# Patient Record
Sex: Female | Born: 1990 | Race: Black or African American | Hispanic: No | Marital: Single | Smoking: Current some day smoker
Health system: Southern US, Community
[De-identification: ages and names within clinical notes are randomized; demographics above are authoritative.]

## PROBLEM LIST (undated history)

## (undated) DIAGNOSIS — S060XAA Concussion with loss of consciousness status unknown, initial encounter: Secondary | ICD-10-CM

## (undated) DIAGNOSIS — R51 Headache: Secondary | ICD-10-CM

## (undated) DIAGNOSIS — S060X9A Concussion with loss of consciousness of unspecified duration, initial encounter: Secondary | ICD-10-CM

---
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
## 2012-04-10 ENCOUNTER — Encounter (HOSPITAL_COMMUNITY): Payer: Self-pay | Admitting: *Deleted

## 2012-04-10 ENCOUNTER — Emergency Department (HOSPITAL_COMMUNITY)
Admission: EM | Admit: 2012-04-10 | Discharge: 2012-04-10 | Disposition: A | Discharge status: Home / Self Care | Payer: Self-pay | Attending: Emergency Medicine | Admitting: Emergency Medicine

## 2012-04-10 DIAGNOSIS — N760 Acute vaginitis: Secondary | ICD-10-CM | POA: Insufficient documentation

## 2012-04-10 DIAGNOSIS — R110 Nausea: Secondary | ICD-10-CM | POA: Insufficient documentation

## 2012-04-10 DIAGNOSIS — R10819 Abdominal tenderness, unspecified site: Secondary | ICD-10-CM | POA: Insufficient documentation

## 2012-04-10 DIAGNOSIS — B9689 Other specified bacterial agents as the cause of diseases classified elsewhere: Secondary | ICD-10-CM | POA: Insufficient documentation

## 2012-04-10 DIAGNOSIS — A499 Bacterial infection, unspecified: Secondary | ICD-10-CM | POA: Insufficient documentation

## 2012-04-10 DIAGNOSIS — R109 Unspecified abdominal pain: Secondary | ICD-10-CM | POA: Insufficient documentation

## 2012-04-10 HISTORY — DX: Concussion with loss of consciousness of unspecified duration, initial encounter: S06.0X9A

## 2012-04-10 HISTORY — DX: Headache: R51

## 2012-04-10 HISTORY — DX: Concussion with loss of consciousness status unknown, initial encounter: S06.0XAA

## 2012-04-10 LAB — CBC WITH DIFFERENTIAL/PLATELET
Basophils Absolute: 0 10*3/uL (ref 0.0–0.1)
Basophils Relative: 0 % (ref 0–1)
Eosinophils Absolute: 0 10*3/uL (ref 0.0–0.7)
Eosinophils Relative: 1 % (ref 0–5)
Hemoglobin: 13 g/dL (ref 12.0–15.0)
Lymphocytes Relative: 21 % (ref 12–46)
Lymphs Abs: 1.1 10*3/uL (ref 0.7–4.0)
MCH: 30.1 pg (ref 26.0–34.0)
MCHC: 34 g/dL (ref 30.0–36.0)
Monocytes Absolute: 0.3 10*3/uL (ref 0.1–1.0)
Monocytes Relative: 6 % (ref 3–12)
RBC: 4.32 MIL/uL (ref 3.87–5.11)
WBC: 5.4 10*3/uL (ref 4.0–10.5)

## 2012-04-10 LAB — POCT I-STAT, CHEM 8
BUN: 11 mg/dL (ref 6–23)
Calcium, Ion: 1.25 mmol/L — ABNORMAL HIGH (ref 1.12–1.23)
Glucose, Bld: 96 mg/dL (ref 70–99)

## 2012-04-10 LAB — WET PREP, GENITAL: Yeast Wet Prep HPF POC: NONE SEEN

## 2012-04-10 LAB — URINALYSIS, ROUTINE W REFLEX MICROSCOPIC
Bilirubin Urine: NEGATIVE
Glucose, UA: NEGATIVE mg/dL
Leukocytes, UA: NEGATIVE

## 2012-04-10 MED ORDER — IBUPROFEN 600 MG PO TABS: 600.0000 mg | ORAL_TABLET | Freq: Three times a day (TID) | ORAL | Status: DC | PRN

## 2012-04-10 MED ORDER — LIDOCAINE HCL (PF) 1 % IJ SOLN
INTRAMUSCULAR | Status: AC
  Filled 2012-04-10: qty 5

## 2012-04-10 MED ORDER — ONDANSETRON 4 MG PO TBDP
4.0000 mg | ORAL_TABLET | Freq: Once | ORAL | Status: AC
  Administered 2012-04-10: 4 mg via ORAL
  Filled 2012-04-10: qty 1

## 2012-04-10 MED ORDER — IBUPROFEN 400 MG PO TABS
600.0000 mg | ORAL_TABLET | Freq: Once | ORAL | Status: AC
  Administered 2012-04-10: 600 mg via ORAL
  Filled 2012-04-10: qty 2

## 2012-04-10 MED ORDER — METRONIDAZOLE 500 MG PO TABS: 500.0000 mg | ORAL_TABLET | Freq: Two times a day (BID) | ORAL | Status: DC

## 2012-04-10 MED ORDER — AZITHROMYCIN 250 MG PO TABS
1000.0000 mg | ORAL_TABLET | Freq: Once | ORAL | Status: AC
  Administered 2012-04-10: 1000 mg via ORAL
  Filled 2012-04-10: qty 4

## 2012-04-10 MED ORDER — CEFTRIAXONE SODIUM 250 MG IJ SOLR
250.0000 mg | Freq: Once | INTRAMUSCULAR | Status: AC
  Administered 2012-04-10: 250 mg via INTRAMUSCULAR
  Filled 2012-04-10: qty 250

## 2012-04-10 NOTE — ED Provider Notes (Signed)
Chronic intermittent mild headaches off-and-on for years lasting anywhere from several hours to several days at a time with gradual onset no sudden onset and no change in speech vision swallowing or understanding as well as no focal weakness numbness or incoordination or vertigo. She also has intermittent vague mild periumbilical abdominal pain off-and-on several hours at a time for the last couple weeks without associated vomiting diarrhea vaginal bleeding vaginal discharge or dysuria. She is no back pain chest pain or shortness breath. She does have some nausea. Her abdomen is soft and nontender at this time.  Hurman Horn, MD 04/11/12 [phone]

## 2012-04-10 NOTE — ED Provider Notes (Signed)
History     CSN: [id number]  Arrival date & time 04/10/12  1049   First MD Initiated Contact with Patient 04/10/12 1540      No chief complaint on file.   (Consider location/radiation/quality/duration/timing/severity/associated sxs/prior treatment) Patient is a 21 y.o. female presenting with abdominal pain. The history is provided by the patient.  Abdominal Pain The primary symptoms of the illness include abdominal pain, nausea and dysuria. The primary symptoms of the illness do not include fever, fatigue, shortness of breath, vomiting, diarrhea, vaginal discharge or vaginal bleeding. Episode onset: intermittently for several weeks. The onset of the illness was gradual. The problem has not changed since onset. The abdominal pain began more than 2 days ago. The pain came on gradually. The abdominal pain has been unchanged since its onset. The abdominal pain is located in the periumbilical region. The abdominal pain does not radiate. The abdominal pain is relieved by nothing.  Nausea began more than 1 week ago.  The dysuria is not associated with hematuria, frequency, urgency or vaginal pain. Dyspareunia: crampy feeling when urinating.   Additional symptoms associated with the illness include back pain. Symptoms associated with the illness do not include chills, anorexia, diaphoresis, heartburn, constipation, urgency, hematuria or frequency.    Past Medical History  Diagnosis Date  . Headache   . Concussion     from basketball in high school     History reviewed. No pertinent past surgical history.  History reviewed. No pertinent family history.  History  Substance Use Topics  . Smoking status: Current Some Day Smoker  . Smokeless tobacco: Not on file  . Alcohol Use: No    OB History    Grav Para Term Preterm Abortions TAB SAB Ect Mult Living                  Review of Systems  Constitutional: Negative for fever, chills, diaphoresis and fatigue.  Respiratory: Negative  for cough and shortness of breath.   Cardiovascular: Negative for chest pain.  Gastrointestinal: Positive for nausea and abdominal pain. Negative for heartburn, vomiting, diarrhea, constipation and anorexia.  Genitourinary: Positive for dysuria. Negative for urgency, frequency, hematuria, decreased urine volume, vaginal bleeding, vaginal discharge and vaginal pain. Dyspareunia: crampy feeling when urinating.  Musculoskeletal: Positive for back pain.  Neurological: Negative for headaches.  All other systems reviewed and are negative.    Allergies  Review of patient's allergies indicates no known allergies.  Home Medications  No current outpatient prescriptions on file.  BP 111/62  Pulse 90  Temp 98.1 F (36.7 C) (Oral)  Resp 18  SpO2 100%  LMP 02/24/2012  Physical Exam  Nursing note and vitals reviewed. Constitutional: She is oriented to person, place, and time. She appears well-developed and well-nourished. No distress.  HENT:  Head: Normocephalic and atraumatic.  Eyes: EOM are normal. Pupils are equal, round, and reactive to light.  Neck: Normal range of motion.  Cardiovascular: Normal rate and normal heart sounds.   Pulmonary/Chest: Effort normal and breath sounds normal. No respiratory distress.  Abdominal: Soft. She exhibits no distension. There is tenderness in the periumbilical area. There is CVA tenderness (right side).    Musculoskeletal: Normal range of motion.  Neurological: She is alert and oriented to person, place, and time.  Skin: Skin is warm and dry.    ED Course  Procedures (including critical care time)  Labs Reviewed  POCT I-STAT, CHEM 8 - Abnormal; Notable for the following:    Calcium, Ion 1.25 (*)  All other components within normal limits  WET PREP, GENITAL - Abnormal; Notable for the following:    Clue Cells Wet Prep HPF POC MODERATE (*)     WBC, Wet Prep HPF POC FEW (*)     All other components within normal limits  URINALYSIS, ROUTINE W  REFLEX MICROSCOPIC  CBC WITH DIFFERENTIAL  PREGNANCY, URINE  GC/CHLAMYDIA PROBE AMP, GENITAL   No results found.   1. Abdominal pain   2. Bacterial vaginosis       MDM  3:40 PM Pt seen and examined. Pt complains of intermittent abdominal pain off and on for several weeks. It is associated with intermittent nausea. Pt also mentions a mild headache that she has been told is from her concussions while playing basketball. Will do pelvic. Benign exam-no focal tenderness or neuro deficits. Do not feel patient needs imaging for these intermittent symptoms.  4:15 PM Pt with no pain on formal pelvic. Will treat for asx chlamydia/GC. Once pregnancy test is back, will treat pain with motrin.  5:20 PM Wet prep also shows clue cells, so will also treat for BV.         Daleen Bo, MD 04/11/12 [phone]

## 2012-04-10 NOTE — ED Notes (Signed)
Feel so hot. Nausea. Started on/off for a while; real bad last night. Cramping.

## 2012-04-11 NOTE — ED Provider Notes (Signed)
I saw and evaluated the patient, reviewed the resident's note and I agree with the findings and plan.  Hurman Horn, MD 04/11/12 [phone]

## 2013-03-04 ENCOUNTER — Emergency Department (HOSPITAL_COMMUNITY): Payer: Medicaid Other

## 2013-03-04 ENCOUNTER — Encounter (HOSPITAL_COMMUNITY): Payer: Self-pay | Admitting: Adult Health

## 2013-03-04 ENCOUNTER — Emergency Department (HOSPITAL_COMMUNITY)
Admission: EM | Admit: 2013-03-04 | Discharge: 2013-03-05 | Disposition: A | Discharge status: Home / Self Care | Payer: Medicaid Other | Attending: Emergency Medicine | Admitting: Emergency Medicine

## 2013-03-04 DIAGNOSIS — N760 Acute vaginitis: Secondary | ICD-10-CM | POA: Insufficient documentation

## 2013-03-04 DIAGNOSIS — K5900 Constipation, unspecified: Secondary | ICD-10-CM | POA: Insufficient documentation

## 2013-03-04 DIAGNOSIS — R112 Nausea with vomiting, unspecified: Secondary | ICD-10-CM | POA: Insufficient documentation

## 2013-03-04 DIAGNOSIS — A499 Bacterial infection, unspecified: Secondary | ICD-10-CM | POA: Insufficient documentation

## 2013-03-04 DIAGNOSIS — Z3202 Encounter for pregnancy test, result negative: Secondary | ICD-10-CM | POA: Insufficient documentation

## 2013-03-04 DIAGNOSIS — R509 Fever, unspecified: Secondary | ICD-10-CM | POA: Insufficient documentation

## 2013-03-04 DIAGNOSIS — R51 Headache: Secondary | ICD-10-CM | POA: Insufficient documentation

## 2013-03-04 DIAGNOSIS — J029 Acute pharyngitis, unspecified: Secondary | ICD-10-CM | POA: Insufficient documentation

## 2013-03-04 DIAGNOSIS — Z79899 Other long term (current) drug therapy: Secondary | ICD-10-CM | POA: Insufficient documentation

## 2013-03-04 DIAGNOSIS — F17200 Nicotine dependence, unspecified, uncomplicated: Secondary | ICD-10-CM | POA: Insufficient documentation

## 2013-03-04 DIAGNOSIS — N83209 Unspecified ovarian cyst, unspecified side: Secondary | ICD-10-CM

## 2013-03-04 DIAGNOSIS — B9689 Other specified bacterial agents as the cause of diseases classified elsewhere: Secondary | ICD-10-CM | POA: Insufficient documentation

## 2013-03-04 DIAGNOSIS — R059 Cough, unspecified: Secondary | ICD-10-CM | POA: Insufficient documentation

## 2013-03-04 DIAGNOSIS — R05 Cough: Secondary | ICD-10-CM | POA: Insufficient documentation

## 2013-03-04 LAB — WET PREP, GENITAL: Trich, Wet Prep: NONE SEEN

## 2013-03-04 LAB — URINALYSIS, ROUTINE W REFLEX MICROSCOPIC
Glucose, UA: NEGATIVE mg/dL
Hgb urine dipstick: NEGATIVE
Leukocytes, UA: NEGATIVE
Protein, ur: NEGATIVE mg/dL
Specific Gravity, Urine: 1.023 (ref 1.005–1.030)

## 2013-03-04 LAB — RAPID STREP SCREEN (MED CTR MEBANE ONLY): Streptococcus, Group A Screen (Direct): NEGATIVE

## 2013-03-04 MED ORDER — IOHEXOL 300 MG/ML  SOLN
100.0000 mL | Freq: Once | INTRAMUSCULAR | Status: AC | PRN
  Administered 2013-03-04: 100 mL via INTRAVENOUS

## 2013-03-04 MED ORDER — ONDANSETRON HCL 4 MG/2ML IJ SOLN
4.0000 mg | Freq: Once | INTRAMUSCULAR | Status: AC
  Administered 2013-03-04: 4 mg via INTRAVENOUS
  Filled 2013-03-04: qty 2

## 2013-03-04 MED ORDER — MORPHINE SULFATE 2 MG/ML IJ SOLN
2.0000 mg | Freq: Once | INTRAMUSCULAR | Status: AC
  Administered 2013-03-04: 2 mg via INTRAVENOUS
  Filled 2013-03-04: qty 1

## 2013-03-04 MED ORDER — SODIUM CHLORIDE 0.9 % IV BOLUS (SEPSIS)
1000.0000 mL | Freq: Once | INTRAVENOUS | Status: AC
  Administered 2013-03-04: 1000 mL via INTRAVENOUS

## 2013-03-04 MED ORDER — IOHEXOL 300 MG/ML  SOLN
25.0000 mL | INTRAMUSCULAR | Status: AC
  Administered 2013-03-04: 25 mL via ORAL

## 2013-03-04 NOTE — ED Notes (Signed)
Patient transported to CT 

## 2013-03-04 NOTE — ED Provider Notes (Signed)
CSN: [id number]     Arrival date & time 03/04/13  1550 History   First MD Initiated Contact with Patient 03/04/13 1702     Chief Complaint  Patient presents with  . multiple complaints    (Consider location/radiation/quality/duration/timing/severity/associated sxs/prior Treatment) The history is provided by the patient. No language interpreter was used.  Kaitlin Duran is a 22 y/o F with history of headache and concussion presenting to emergency department with multiple complaints. Patient reported that she has been having ongoing sore throat for the past couple days, described the sore throat as a tightening, hard pain that gets worse with foods more so than with liquids. Patient reported that she's still able to swallow. Reported that she's been using hot tea and gargling with salt water with minimal relief. Patient reported that she's developed a headache a couple of days ago that is intermittent, waxing and waning described as a frontal headache with radiation to bilateral temples described as a stabbing sensation. Reported that she has used nothing for the discomfort or relief of headache. Patient reported that she's been having lower abdominal pain described as a throbbing intermittent pain with a constant aching sensation, without radiation, with episodes of emesis and nausea. Patient reported nothing triggers the pain. Patient reported subjective fevers. Reported that she's been having a dry cough for the past couple of days. Patient reported that her last menstrual period was 12/28/2012. Patient reported that she took one dose of Depo-Provera. Reported that she's not sexually active. Denied chills, blurred vision, spotty vision, sudden loss of vision, fainting, Kaitlin Duran cautious when chills, dysuria, sores, lesions, vaginal discharge, sick contacts, chest pain, short of breath, difficulty breathing, diarrhea, melena, hematochezia. PCP none  Past Medical History  Diagnosis Date  .  Headache(784.0)   . Concussion     from basketball in high school    History reviewed. No pertinent past surgical history. History reviewed. No pertinent family history. History  Substance Use Topics  . Smoking status: Current Some Day Smoker  . Smokeless tobacco: Not on file  . Alcohol Use: No   OB History   Grav Para Term Preterm Abortions TAB SAB Ect Mult Living                 Review of Systems  Constitutional: Positive for chills. Negative for fever.  HENT: Positive for sore throat. Negative for congestion, trouble swallowing, neck pain and neck stiffness.   Eyes: Negative for visual disturbance.  Respiratory: Positive for cough. Negative for chest tightness and shortness of breath.   Cardiovascular: Negative for chest pain.  Gastrointestinal: Positive for nausea and abdominal pain. Negative for vomiting, diarrhea, blood in stool and anal bleeding.  Genitourinary: Negative for dysuria, vaginal bleeding, vaginal discharge and vaginal pain.  Neurological: Positive for headaches. Negative for dizziness and weakness.  All other systems reviewed and are negative.    Allergies  Peanut-containing drug products  Home Medications   Current Outpatient Rx  Name  Route  Sig  Dispense  Refill  . metroNIDAZOLE (FLAGYL) 500 MG tablet   Oral   Take 1 tablet (500 mg total) by mouth 2 (two) times daily.   14 tablet   0   . polyethylene glycol (MIRALAX / GLYCOLAX) packet   Oral   Take 17 g by mouth daily.   14 each   0    BP 101/72  Pulse 90  Temp(Src) 98.8 F (37.1 C) (Oral)  Resp 16  Wt 135 lb (61.236 kg)  SpO2  99%  LMP 12/28/2012 Physical Exam  Nursing note and vitals reviewed. Constitutional: She is oriented to person, place, and time. She appears well-developed and well-nourished. No distress.  Patient found sitting up in bed watching television, patient appears comfortable. Food noted in room.  HENT:  Head: Normocephalic and atraumatic.  Mouth/Throat:  Oropharynx is clear and moist. No oropharyngeal exudate.  Negative swelling, erythema, exudate noted to the tonsils bilaterally. Negative petechiae noted to the soft palate or posterior oropharynx. Negative swelling, erythema, inflammation noted to the posterior oropharynx. Negative signs of peritonsillar abscess.  Eyes: Conjunctivae and EOM are normal. Pupils are equal, round, and reactive to light.  Neck: Normal range of motion. Neck supple.  Negative neck stiffness Negative nuchal rigidity Negative cervical lymphadenopathy Negative pain upon palpation to the cervical spine  Cardiovascular: Normal rate and regular rhythm.   Pulses:      Radial pulses are 2+ on the right side, and 2+ on the left side.       Dorsalis pedis pulses are 2+ on the right side, and 2+ on the left side.  Pulmonary/Chest: Effort normal and breath sounds normal. No respiratory distress. She has no wheezes. She has no rales. She exhibits no tenderness.  Abdominal: Soft. Bowel sounds are normal. She exhibits no distension. There is tenderness. There is no rebound and no guarding.  Pain upon palpation to the RLQ, suprapubic region, LLQ Positive McBurney's point Negative psoas and obturator Negative Murphy's sign  Genitourinary: Vagina normal. No vaginal discharge found.  Negative swelling, inflammation, erythema, lesions, sores noted to the external genitalia. Negative swelling, erythema, lesions, sores noted to the vaginal canal. Negative blood in the vaginal vault. Yeast-like discharge noted to the cervical region. Mild CMT noted. Discomfort upon palpation to the adnexal region bilaterally.   Musculoskeletal: Normal range of motion.  Lymphadenopathy:    She has no cervical adenopathy.  Neurological: She is alert and oriented to person, place, and time. No cranial nerve deficit. She exhibits normal muscle tone. Coordination normal.  Skin: Skin is warm and dry. No rash noted. She is not diaphoretic. No erythema.   Psychiatric: She has a normal mood and affect. Her behavior is normal. Thought content normal.    ED Course  Procedures (including critical care time)  7:30 PM Discussed case with Dr. Kathie Rhodes. Rancour, recommended CT scan to be performed.   03/05/2013 1:23 AM Discussed labs and imaging with patient while this provider was at bedside. Discussed with patient plan for discharge. All questions answered.   Labs Review Labs Reviewed  WET PREP, GENITAL - Abnormal; Notable for the following:    Clue Cells Wet Prep HPF POC FEW (*)    WBC, Wet Prep HPF POC FEW (*)    All other components within normal limits  CBC WITH DIFFERENTIAL - Abnormal; Notable for the following:    HCT 35.6 (*)    All other components within normal limits  RAPID STREP SCREEN  CULTURE, GROUP A STREP  GC/CHLAMYDIA PROBE AMP  URINALYSIS, ROUTINE W REFLEX MICROSCOPIC  COMPREHENSIVE METABOLIC PANEL  POCT PREGNANCY, URINE   Imaging Review Ct Abdomen Pelvis W Contrast  03/04/2013   CLINICAL DATA:  Two days sore throat, worse when the or drinking. Lower abdominal pain.  EXAM: CT ABDOMEN AND PELVIS WITH CONTRAST  TECHNIQUE: Multidetector CT imaging of the abdomen and pelvis was performed using the standard protocol following bolus administration of intravenous contrast.  CONTRAST:  OMNIPAQUE IOHEXOL 300 MG/ML  SOLN  COMPARISON:  None.  FINDINGS: The lung bases are clear.  The liver, spleen, gallbladder, pancreas, adrenal glands, kidneys, abdominal aorta, inferior vena cava, and retroperitoneal lymph nodes are unremarkable. The stomach is not abnormally distended and there is no gastric wall thickening. Small bowel are not distended. Stool and gas in the colon without distention. No free air or free fluid in the abdomen. Small umbilical hernia containing 1 wall of the transverse colon there is  Pelvis: There is an involuting cyst in the right ovary. Uterus and ovaries are not enlarged. Bladder wall is not thickened. No free or  loculated pelvic fluid collections. The appendix is normal. Stool-filled rectosigmoid colon without evidence of diverticulitis. No significant pelvic lymphadenopathy. Normal alignment of the lumbar vertebrae.  IMPRESSION: Involuting cyst in the right ovary. No acute process demonstrated in the abdomen or pelvis.   Electronically Signed   By: Burman Nieves   On: 03/04/2013 23:38    MDM   1. Ovarian cyst   2. Bacterial vaginosis   3. Constipation     Patient presenting to emergency department with multiple complaints. Patient reported that she has been having discomfort with swallowing has been ongoing for the past couple of days with association of headache. Patient reported that she's been experiencing lower abdominal pain localized to the right lower quadrant, left lower quadrant, suprapubic region that started on Saturday with associated nausea-denied vomiting. Alert and oriented. Discomfort upon palpation to the right lower quadrant, suprapubic region, left lower quadrant. Positive McBurney's point. Negative psoas and obturator. Negative Murphy sign. Bowel sounds normal active in all 4 quadrants. Negative acute abdomen, negative peritoneal signs. Eyes normal, PERRLA intact. Negative neurological deficits identified. Negative vaginal discharge identified upon pelvic exam. Mild discomfort upon suprapubic palpation with right and left adnexal tenderness upon palpation to the pelvic exam. Negative cervical motion tenderness identified. Rapid strep test negative. CBC negative findings. CMP negative findings. Urinalysis negative findings for infection. Urine pregnancy negative. CT abdomen pelvis with contrast involuting cyst to the right ovary. Appendix is normal. Stool filled rectosigmoid colon without evidence of diverticulitis.  Patient stable, afebrile. Suspicion of discomfort secondary to BV, constipation. No enlargement of the ovaries identified - doubt ovarian torsion. Discussed with patient that  she needs to be followed up with [facility] regarding ovarian cyst and the the fact that she has not had a menstrual cycle since December 28, 2012. Discharged patient with antibiotics and Miralax for the constipation. Discussed with patient to rest and stay hydrated. Discussed with patient that if symptoms are to worsen or change to report back to the ED - discussed with patient that if pain is to continue by Sunday to report back to the ED. Strict return instructions given.  Patient agreed to plan of care, understood, all questions answered.      Raymon Mutton, PA-C 03/05/13 1608

## 2013-03-04 NOTE — ED Notes (Addendum)
Presents with 2 days of sore throat worse when eating and drinking, lower abdominal pain, denies vaginal discharge and odor, headaches. Pt is eating and drinking chinese food at this time.  Afebrile.  Denies urinary symptoms

## 2013-03-05 LAB — CBC WITH DIFFERENTIAL/PLATELET
Basophils Absolute: 0 10*3/uL (ref 0.0–0.1)
Basophils Relative: 0 % (ref 0–1)
Hemoglobin: 12.3 g/dL (ref 12.0–15.0)
Lymphocytes Relative: 34 % (ref 12–46)
MCHC: 34.6 g/dL (ref 30.0–36.0)
Neutro Abs: 2.9 10*3/uL (ref 1.7–7.7)
Neutrophils Relative %: 56 % (ref 43–77)
RDW: 13.4 % (ref 11.5–15.5)
WBC: 5.3 10*3/uL (ref 4.0–10.5)

## 2013-03-05 LAB — GC/CHLAMYDIA PROBE AMP
CT Probe RNA: NEGATIVE
GC Probe RNA: NEGATIVE

## 2013-03-05 LAB — COMPREHENSIVE METABOLIC PANEL
ALT: 11 U/L (ref 0–35)
AST: 17 U/L (ref 0–37)
Albumin: 3.8 g/dL (ref 3.5–5.2)
Alkaline Phosphatase: 56 U/L (ref 39–117)
Chloride: 102 mEq/L (ref 96–112)
Potassium: 3.7 mEq/L (ref 3.5–5.1)
Total Bilirubin: 0.5 mg/dL (ref 0.3–1.2)

## 2013-03-05 MED ORDER — MORPHINE SULFATE 2 MG/ML IJ SOLN
2.0000 mg | Freq: Once | INTRAMUSCULAR | Status: AC
  Administered 2013-03-05: 2 mg via INTRAVENOUS
  Filled 2013-03-05: qty 1

## 2013-03-05 MED ORDER — POLYETHYLENE GLYCOL 3350 17 G PO PACK: 17.0000 g | PACK | Freq: Every day | ORAL | Status: DC

## 2013-03-05 MED ORDER — METRONIDAZOLE 500 MG PO TABS: 500.0000 mg | ORAL_TABLET | Freq: Two times a day (BID) | ORAL | Status: DC

## 2013-03-05 MED ORDER — ONDANSETRON HCL 4 MG/2ML IJ SOLN
4.0000 mg | Freq: Once | INTRAMUSCULAR | Status: AC
  Administered 2013-03-05: 4 mg via INTRAVENOUS
  Filled 2013-03-05: qty 2

## 2013-03-06 LAB — CULTURE, GROUP A STREP

## 2013-03-06 NOTE — ED Provider Notes (Signed)
Medical screening examination/treatment/procedure(s) were performed by non-physician practitioner and as supervising physician I was immediately available for consultation/collaboration.   Glynn Octave, MD 03/06/13 [phone]

## 2013-03-07 NOTE — ED Provider Notes (Signed)
03/06/2013 4:21 PM Spoke with patient's Mother, Mother reported that the number on file was not the patient's it was the mother's cellular phone. Mother gave this provider patient's cellphone number.   03/07/2013 3:30 PM Spoke with patient, verified by DOB and address. Patient reported that her abdominal pain has improved, mild discomfort. Patient reported that she is calling tomorrow to set-up an appointment with [facility]. Denied vaginal bleeding, vaginal discharge, nausea, vomiting. Patient reported that she is feeling better. Discussed with patient to continue to monitor symptoms and if symptoms are to worsen or change to report back to the ED - discussed that is abdominal pain is to worsen or change she is to come back to the ED to get re-assessed. Patient understood, agreed to plan, all questions answered.   Raymon Mutton, PA-C 03/07/13 1533

## 2013-03-07 NOTE — ED Provider Notes (Signed)
Medical screening examination/treatment/procedure(s) were performed by non-physician practitioner and as supervising physician I was immediately available for consultation/collaboration.   Glynn Octave, MD 03/07/13 1536

## 2013-04-21 ENCOUNTER — Ambulatory Visit (INDEPENDENT_AMBULATORY_CARE_PROVIDER_SITE_OTHER): Payer: Medicaid Other | Admitting: Obstetrics & Gynecology

## 2013-04-21 ENCOUNTER — Encounter: Payer: Self-pay | Admitting: Obstetrics & Gynecology

## 2013-04-21 VITALS — BP 122/77 | HR 81 | Temp 98.3°F | Ht 63.0 in | Wt 157.4 lb

## 2013-04-21 DIAGNOSIS — N83209 Unspecified ovarian cyst, unspecified side: Secondary | ICD-10-CM

## 2013-04-21 MED ORDER — NORGESTIM-ETH ESTRAD TRIPHASIC 0.18/0.215/0.25 MG-25 MCG PO TABS: 1.0000 | ORAL_TABLET | Freq: Every day | ORAL | Status: DC

## 2013-04-21 NOTE — Progress Notes (Signed)
  Subjective:    Patient ID: Kaitlin Duran, female    DOB: 1990/12/31, 22 y.o.   MRN: [id number]  HPI  [name] is a 22 yo S AA G0 who is here for follow up after a ovarian cyst was seen at the ED last month. She reports that the pain has decreased. It has never been strong enough for her to take even IBU. She declines prescription for IBU today. She is interested in restarting OCPs. She faithfully uses condoms and had no problems with the OCPs in the past. GC and CT were negative 9/14.  Review of Systems  She declines a flu vaccine today. She is in school at [employer].    Objective:   Physical Exam  NSSmid plane, NT, normal adnexal exam      Assessment & Plan:  Pelvic pain- resolving. She wants to restart OCPs. I will send a prescription to her pharmacy. RTC 2 weeks for BP check and then in 2 months to follow up on her pain.

## 2013-04-21 NOTE — Addendum Note (Signed)
Addended by: Faythe Casa on: 04/21/2013 04:20 PM   Modules accepted: Orders

## 2013-04-21 NOTE — Progress Notes (Signed)
Pt. C/o of abdominal pain that is intermittent and cramping in nature and lasts for hours at a time; has been occuring for about a year and started just after pt. Started depo injections. Pt. Had one dose and decided to stop depo provera. Was seen in the ED on 9/11 for pains and they told pt. She may have a cyst.

## 2013-04-21 NOTE — Patient Instructions (Signed)
Oral Contraception Use  Oral contraceptives (OCs) are medicines taken to prevent pregnancy. OCs work by preventing the ovaries from releasing eggs. The hormones in OCs also cause the cervical mucus to thicken, preventing the sperm from entering the uterus. The hormones also cause the uterine lining to become thin, not allowing a fertilized egg to attach to the inside of the uterus. OCs are highly effective when taken exactly as prescribed. However, OCs do not prevent sexually transmitted diseases (STDs). Safe sex practices, such as using condoms along with an OC, can help prevent STDs.   Before taking OCs, you may have a physical exam and Pap test. Your caregiver may also order blood tests if necessary. Your caregiver will make sure you are a good candidate for oral contraception. Discuss with your caregiver the possible side effects of the OC you may be prescribed. When starting an OC, it can take 2 to 3 months for the body to adjust to the changes in hormone levels in your body.   HOW TO TAKE ORAL CONTRACEPTIVES  Your caregiver may advise you on how to start taking the first cycle of OCs. Otherwise, you can:  · Start on day 1 of your menstrual period. You will not need any backup contraceptive protection with this start time.  · Start on the first Sunday after your menstrual period or the day you get your prescription. In these cases, you will need to use backup contraceptive protection for the first 7-day cycle.  After you have started taking OCs:  · If you forget to take 1 pill, take it as soon as you remember. Take the next pill at the regular time.  · If you miss 2 or more pills, use backup birth control until your next menstrual period starts.  · If you use a 28-day pack that contains inactive pills and you miss 1 of the last 7 pills (pills with no hormones), it will not matter. Throw away the rest of the non-hormone pills and start a new pill pack.  No matter which day you start the OC, you will always start  a new pack on that same day of the week. Have an extra pack of OCs and a backup contraceptive method available in case you miss some pills or lose your OC pack.  HOME CARE INSTRUCTIONS   · Do not smoke.  · Always use a condom to protect against STDs. OCs do not protect against STDs.  · Use a calendar to mark your menstrual period days.  · Read the information and directions that come with your OC. Talk to your caregiver if you have questions.  SEEK MEDICAL CARE IF:   · You develop nausea and vomiting.  · You have abnormal vaginal discharge or bleeding.  · You develop a rash.  · You miss your menstrual period.  · You are losing your hair.  · You need treatment for mood swings or depression.  · You get dizzy when taking the OC.  · You develop acne from taking the OC.  · You become pregnant.  SEEK IMMEDIATE MEDICAL CARE IF:   · You develop chest pain.  · You develop shortness of breath.  · You have an uncontrolled or severe headache.  · You develop numbness or slurred speech.  · You develop visual problems.  · You develop pain, redness, and swelling in the legs.  Document Released: 05/30/2011 Document Revised: 09/02/2011 Document Reviewed: 05/30/2011  [employer]® Patient Information ©2014 [employer].

## 2013-06-09 ENCOUNTER — Ambulatory Visit: Payer: Medicaid Other | Admitting: Obstetrics & Gynecology

## 2013-06-18 ENCOUNTER — Other Ambulatory Visit: Payer: Self-pay | Admitting: Obstetrics & Gynecology

## 2013-08-08 ENCOUNTER — Other Ambulatory Visit: Payer: Self-pay | Admitting: Obstetrics & Gynecology

## 2013-08-16 ENCOUNTER — Telehealth: Payer: Self-pay

## 2013-08-16 ENCOUNTER — Other Ambulatory Visit: Payer: Self-pay

## 2013-08-16 NOTE — Telephone Encounter (Signed)
Pt.'s [facility] pharmacy called stating pt. Is requesting a refill of her OCP. Called [facility] who stated pt. Last picked up her prescription 07/16/13 and it was prescribed on 06/30/13. Attempted to call pt. On both available numbers. Left message with pt.'s mother for pt. To call clinic. Medication has been refilled but pt. Will need to be seen in clinic before any other refills are given.

## 2013-08-16 NOTE — Telephone Encounter (Signed)
Pt. Called returning our call. Called pt. And informed her that her OCP has been refilled but that she will need to be seen by provider before any other refills are given. Pt. Verbalized understanding and stated [facility] and will make an appointment for mid march.

## 2013-09-27 ENCOUNTER — Ambulatory Visit: Payer: Medicaid Other | Admitting: Obstetrics & Gynecology

## 2015-05-15 IMAGING — CT CT ABD-PELV W/ CM
2 of 4 series · 16 of 46 positions shown, 18 images · IV contrast (omnipaque)
Comparison: None.

CLINICAL DATA: Two days sore throat, worse when the or drinking.
Lower abdominal pain.

EXAM:
CT ABDOMEN AND PELVIS WITH CONTRAST
TECHNIQUE: Multidetector CT imaging of the abdomen and pelvis was performed
using the standard protocol following bolus administration of
intravenous contrast.
CONTRAST:  100mL OMNIPAQUE IOHEXOL 300 MG/ML  SOLN

[Series 2: abd/ pelvis 5.0 i30f 1 · axial · 0.65mm/px · z∈[-465,-60]mm · 13 of 89 slices shown, 15 images]
[im 4/89  soft-tissue]
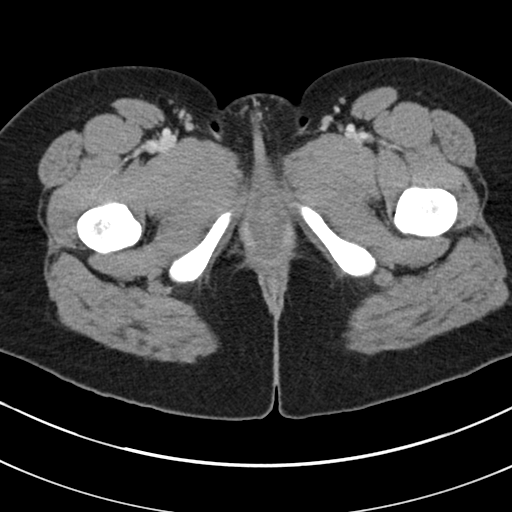
[im 4/89  bone]
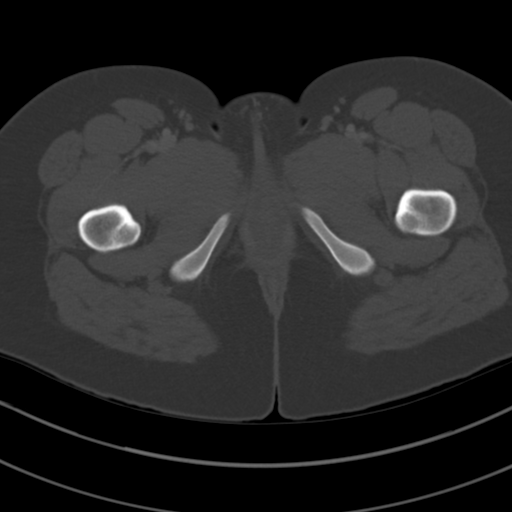
[im 11/89  soft-tissue]
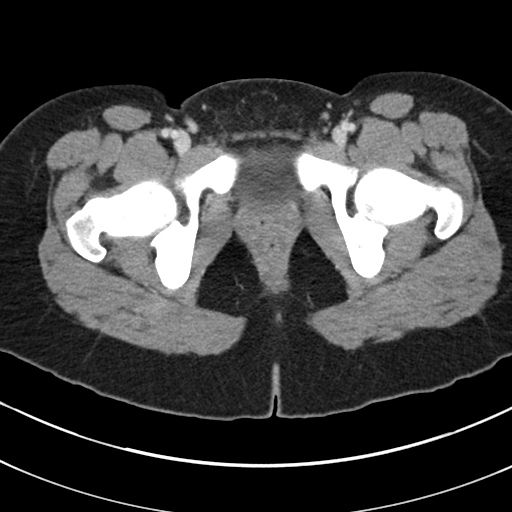
[im 18/89  soft-tissue]
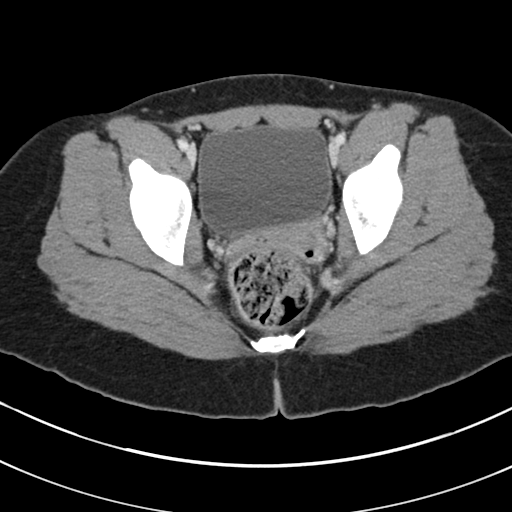
[im 25/89  soft-tissue]
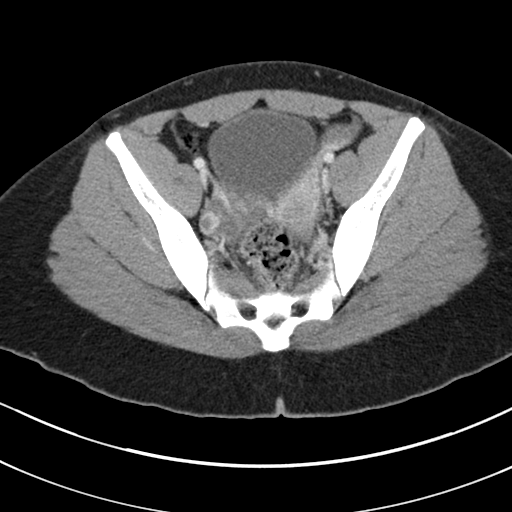
[im 32/89  soft-tissue]
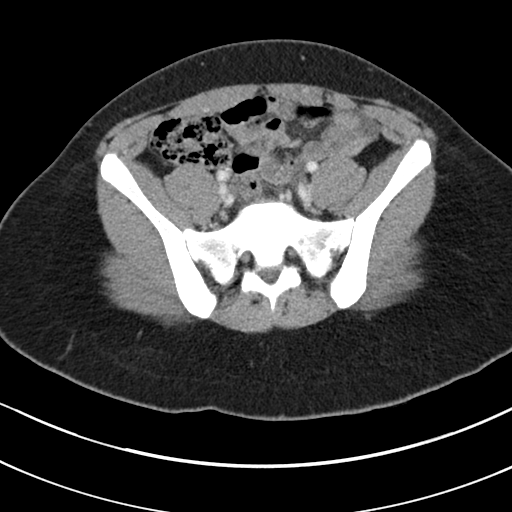
[im 39/89  soft-tissue]
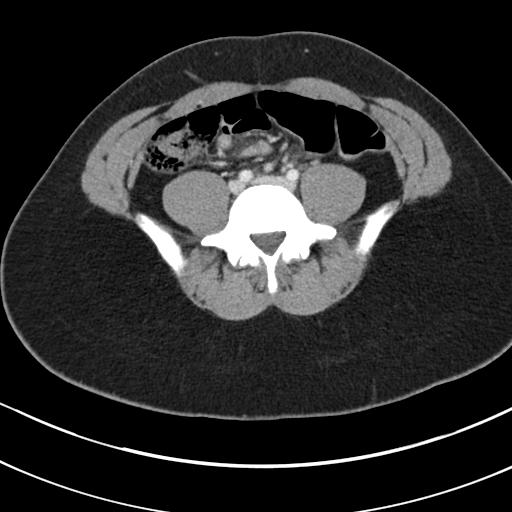
[im 46/89  soft-tissue]
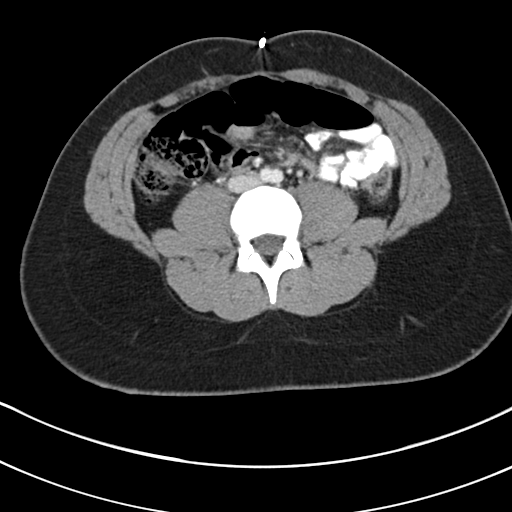
[im 50/89  soft-tissue]
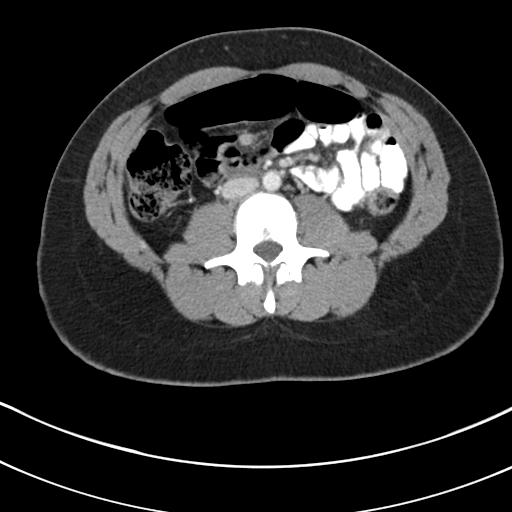
[im 57/89  soft-tissue]
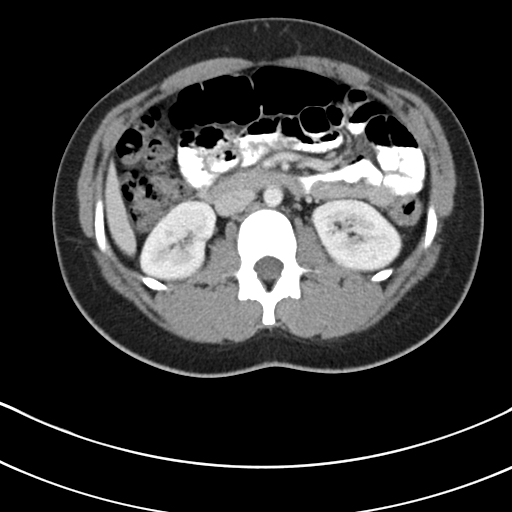
[im 57/89  bone]
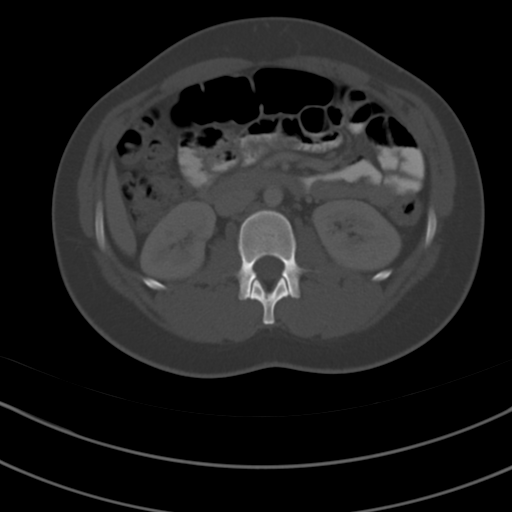
[im 64/89  soft-tissue]
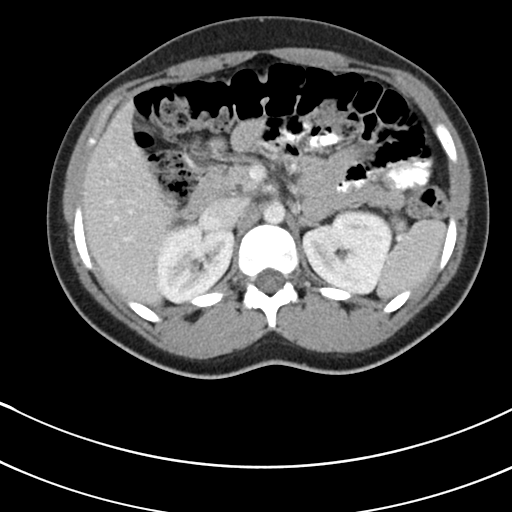
[im 71/89  soft-tissue]
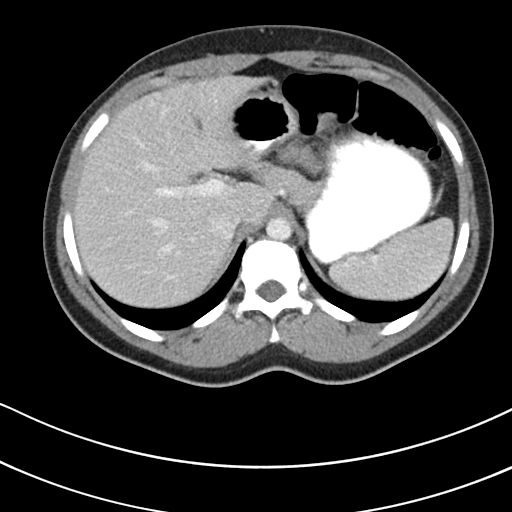
[im 78/89  soft-tissue]
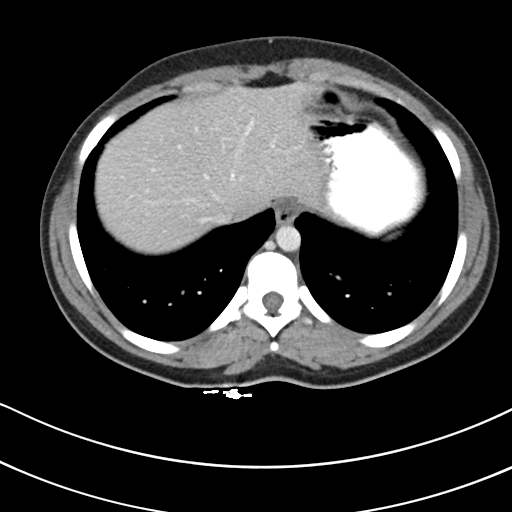
[im 85/89  soft-tissue]
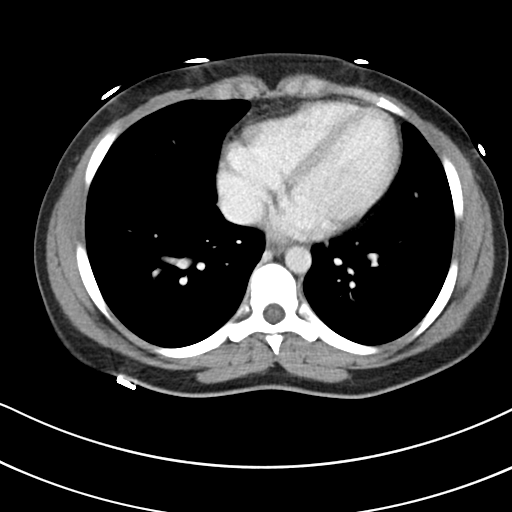

[Series 5: cor · coronal · 0.70mm/px · 3 of 100 slices shown]
[im 34/100  soft-tissue]
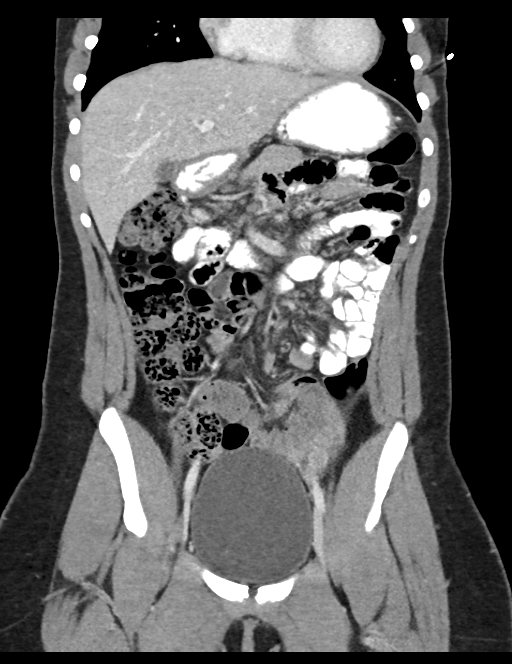
[im 45/100  soft-tissue]
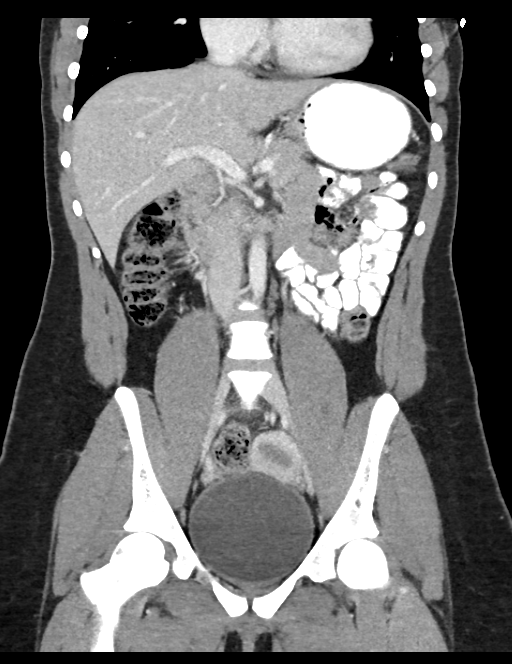
[im 56/100  soft-tissue]
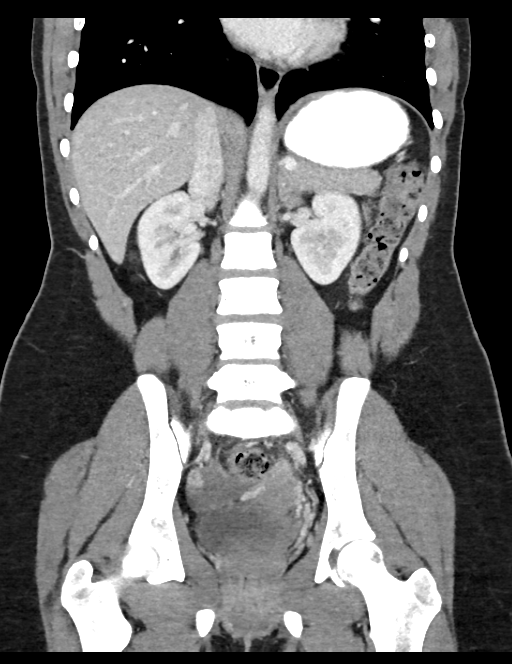

[16 of 46 positions shown; findings below may reference images not displayed]

FINDINGS: The lung bases are clear.

The liver, spleen, gallbladder, pancreas, adrenal glands, kidneys,
abdominal aorta, inferior vena cava, and retroperitoneal lymph nodes
are unremarkable. The stomach is not abnormally distended and there
is no gastric wall thickening. Small bowel are not distended. Stool
and gas in the colon without distention. No free air or free fluid
in the abdomen. Small umbilical hernia containing 1 wall of the
transverse colon there is

Pelvis: There is an involuting cyst in the right ovary. Uterus and
ovaries are not enlarged. Bladder wall is not thickened. No free or
loculated pelvic fluid collections. The appendix is normal.
Stool-filled rectosigmoid colon without evidence of diverticulitis.
No significant pelvic lymphadenopathy. Normal alignment of the
lumbar vertebrae.
IMPRESSION: Involuting cyst in the right ovary. No acute process demonstrated in
the abdomen or pelvis.

## 2015-06-28 ENCOUNTER — Emergency Department (HOSPITAL_COMMUNITY)
Admission: EM | Admit: 2015-06-28 | Discharge: 2015-06-28 | Disposition: A | Discharge status: Home / Self Care | Payer: Self-pay | Attending: Emergency Medicine | Admitting: Emergency Medicine

## 2015-06-28 ENCOUNTER — Emergency Department (HOSPITAL_COMMUNITY): Payer: Medicaid Other

## 2015-06-28 ENCOUNTER — Encounter (HOSPITAL_COMMUNITY): Payer: Self-pay | Admitting: *Deleted

## 2015-06-28 DIAGNOSIS — F17210 Nicotine dependence, cigarettes, uncomplicated: Secondary | ICD-10-CM | POA: Insufficient documentation

## 2015-06-28 DIAGNOSIS — G8929 Other chronic pain: Secondary | ICD-10-CM | POA: Insufficient documentation

## 2015-06-28 DIAGNOSIS — N925 Other specified irregular menstruation: Secondary | ICD-10-CM | POA: Insufficient documentation

## 2015-06-28 DIAGNOSIS — Z87828 Personal history of other (healed) physical injury and trauma: Secondary | ICD-10-CM | POA: Insufficient documentation

## 2015-06-28 DIAGNOSIS — R102 Pelvic and perineal pain: Secondary | ICD-10-CM

## 2015-06-28 DIAGNOSIS — Z3202 Encounter for pregnancy test, result negative: Secondary | ICD-10-CM | POA: Insufficient documentation

## 2015-06-28 DIAGNOSIS — B3731 Acute candidiasis of vulva and vagina: Secondary | ICD-10-CM

## 2015-06-28 DIAGNOSIS — R197 Diarrhea, unspecified: Secondary | ICD-10-CM | POA: Insufficient documentation

## 2015-06-28 DIAGNOSIS — R1084 Generalized abdominal pain: Secondary | ICD-10-CM

## 2015-06-28 DIAGNOSIS — B373 Candidiasis of vulva and vagina: Secondary | ICD-10-CM | POA: Insufficient documentation

## 2015-06-28 DIAGNOSIS — Z79899 Other long term (current) drug therapy: Secondary | ICD-10-CM | POA: Insufficient documentation

## 2015-06-28 LAB — URINALYSIS, ROUTINE W REFLEX MICROSCOPIC
Bilirubin Urine: NEGATIVE
GLUCOSE, UA: NEGATIVE mg/dL
Hgb urine dipstick: NEGATIVE
KETONES UR: NEGATIVE mg/dL
LEUKOCYTES UA: NEGATIVE
NITRITE: NEGATIVE
PROTEIN: NEGATIVE mg/dL
Specific Gravity, Urine: 1.018 (ref 1.005–1.030)
pH: 8 (ref 5.0–8.0)

## 2015-06-28 LAB — COMPREHENSIVE METABOLIC PANEL
ALT: 15 U/L (ref 14–54)
AST: 21 U/L (ref 15–41)
Albumin: 3.9 g/dL (ref 3.5–5.0)
Alkaline Phosphatase: 59 U/L (ref 38–126)
Anion gap: 10 (ref 5–15)
BILIRUBIN TOTAL: 0.7 mg/dL (ref 0.3–1.2)
BUN: 11 mg/dL (ref 6–20)
CHLORIDE: 108 mmol/L (ref 101–111)
CO2: 25 mmol/L (ref 22–32)
CREATININE: 0.62 mg/dL (ref 0.44–1.00)
Calcium: 9.7 mg/dL (ref 8.9–10.3)
Glucose, Bld: 88 mg/dL (ref 65–99)
Potassium: 4.3 mmol/L (ref 3.5–5.1)
Sodium: 143 mmol/L (ref 135–145)
TOTAL PROTEIN: 7.4 g/dL (ref 6.5–8.1)

## 2015-06-28 LAB — I-STAT BETA HCG BLOOD, ED (MC, WL, AP ONLY)

## 2015-06-28 LAB — WET PREP, GENITAL
Clue Cells Wet Prep HPF POC: NONE SEEN
SPERM: NONE SEEN
TRICH WET PREP: NONE SEEN

## 2015-06-28 LAB — CBC
HCT: 35.5 % — ABNORMAL LOW (ref 36.0–46.0)
Hemoglobin: 11.4 g/dL — ABNORMAL LOW (ref 12.0–15.0)
MCH: 27.5 pg (ref 26.0–34.0)
MCHC: 32.1 g/dL (ref 30.0–36.0)
MCV: 85.7 fL (ref 78.0–100.0)
PLATELETS: 268 10*3/uL (ref 150–400)
RBC: 4.14 MIL/uL (ref 3.87–5.11)
RDW: 13.8 % (ref 11.5–15.5)
WBC: 4.1 10*3/uL (ref 4.0–10.5)

## 2015-06-28 LAB — LIPASE, BLOOD: LIPASE: 25 U/L (ref 11–51)

## 2015-06-28 MED ORDER — FLUCONAZOLE 100 MG PO TABS
150.0000 mg | ORAL_TABLET | Freq: Once | ORAL | Status: AC
  Administered 2015-06-28: 150 mg via ORAL
  Filled 2015-06-28: qty 2

## 2015-06-28 MED ORDER — KETOROLAC TROMETHAMINE 30 MG/ML IJ SOLN
30.0000 mg | Freq: Once | INTRAMUSCULAR | Status: AC
  Administered 2015-06-28: 30 mg via INTRAMUSCULAR
  Filled 2015-06-28: qty 1

## 2015-06-28 NOTE — ED Notes (Signed)
Pt reports chronic abd pain x 2 years but has been constant recently. Reports nausea and irregular bowel movements.

## 2015-06-28 NOTE — Discharge Instructions (Signed)
Please read and follow all provided instructions.  Your diagnoses today include:  1. Generalized abdominal pain   2. Pelvic pain in female   3. Yeast infection of the vagina    Tests performed today include:  Vital signs. See below for your results today.   Medications prescribed:   Diflucan 150mg  PO with one refill  Home care instructions:  Follow any educational materials contained in this packet.  Follow-up instructions: Please follow-up with your primary care provider in the next 48 hours for further evaluation of symptoms and treatment. I have provided a list of Primary Care Providers you can see for management of your chronic abdominal pain.    Return instructions:   Please return to the Emergency Department if you do not get better, if you get worse, or new symptoms OR  - Fever (temperature greater than 101.6F)  - Bleeding that does not stop with holding pressure to the area    -Severe pain (please note that you may be more sore the day after your accident)  - Chest Pain  - Difficulty breathing  - Severe nausea or vomiting  - Inability to tolerate food and liquids  - Passing out  - Skin becoming red around your wounds  - Change in mental status (confusion or lethargy)  - New numbness or weakness     Please return if you have any other emergent concerns.  Additional Information:  Your vital signs today were: BP 95/73 mmHg   Pulse 79   Temp(Src) 99 F (37.2 C) (Oral)   Resp 20   SpO2 98%   LMP 05/14/2015 If your blood pressure (BP) was elevated above 135/85 this visit, please have this repeated by your doctor within one month. ---------------     Emergency Department Resource Guide 1) Find a Doctor and Pay Out of Pocket Although you won't have to find out who is covered by your insurance plan, it is a good idea to ask around and get recommendations. You will then need to call the office and see if the doctor you have chosen will accept you as a new patient and  what types of options they offer for patients who are self-pay. Some doctors offer discounts or will set up payment plans for their patients who do not have insurance, but you will need to ask so you aren't surprised when you get to your appointment.  2) Contact Your Local Health Department Not all health departments have doctors that can see patients for sick visits, but many do, so it is worth a call to see if yours does. If you don't know where your local health department is, you can check in your phone book. The CDC also has a tool to help you locate your state's health department, and many state websites also have listings of all of their local health departments.  3) Find a Walk-in Clinic If your illness is not likely to be very severe or complicated, you may want to try a walk in clinic. These are popping up all over the country in pharmacies, drugstores, and shopping centers. They're usually staffed by nurse practitioners or physician assistants that have been trained to treat common illnesses and complaints. They're usually fairly quick and inexpensive. However, if you have serious medical issues or chronic medical problems, these are probably not your best option.  No Primary Care Doctor: - Call Health Connect at  [phone] - they can help you locate a primary care doctor that  accepts your  insurance, provides certain services, etc. - Physician Referral Service- [phone]  Chronic Pain Problems: Organization         Address  Phone   Notes  [facility]  [phone] Patients need to be referred by their primary care doctor.   Medication Assistance: Organization         Address  Phone   Notes  [facility] Medication [facility] [address] [place] [place], [place] [address] [phone] --Must be a resident of [facility] -- Must have NO insurance coverage whatsoever (no Medicaid/ Medicare, etc.) -- The pt. MUST have a primary care doctor  that directs their care regularly and follows them in the community   [facility]  [phone]   [employer]  [phone]    Agencies that provide inexpensive medical care: Organization         Address  Phone   Notes  [facility]  [phone]   [facility]    [phone]   [facility] [address] [place], [place] [address] [phone]   [facility] [address] [place] [place] [phone]   Planned Parenthood    [phone]   [facility]    [phone]   [facility]  [address], [place] Phone:  [phone], Fax:  [phone] Hours of Operation:  9 am - 6 pm, M-F.  Also accepts Medicaid/Medicare and self-pay.  [facility]  [address], [place] Phone: [phone], Fax: [phone]. Hours of Operation:  8:30 am - 5:30 pm, M-F.  Also accepts Medicaid and self-pay.  [facility] [place] [address] [place] Phone: [phone]   [facility] [address] Natasha Bence [place], [place] [phone], Ext. [phone] Mondays & Thursdays: 7-9 AM.  First 15 patients are seen on a first come, first serve basis.    Medicaid-accepting [facility] Providers:  Organization         Address  Phone   Notes  [facility] [address], [place] [phone] Also accepts self-pay patients.  [facility] [address] Laurell Josephs [place], [place]  [phone]   [facility] [address], [place] [phone]   [facility] [address], [place] [phone]   Renaye Rakers [address], [place]   [phone] Only accepts [place] Access [place] patients after they have their name applied to their card.   Self-Pay (no insurance) in [facility]:  Organization          Address  Phone   Notes  Sickle Cell Patients, [facility] Internal Medicine [address] [place], [place] [phone]   [facility] Urgent Care [address] [place], [place] [phone]   [facility] [place]  [address],  [phone]   [facility]/Dr. Osei-Bonsu  [address] [place] or [address], [place] [phone] Phone number for both [place] and [place] locations is the same.  [facility] [address], [facility] [phone]   [facility] [address], [place] or [address] [phone]   [facility] [phone]  [address], [place] [phone], phone; [phone], fax Sees patients 1st and 3rd Saturday of every month.  Must not qualify for public or private insurance (i.e. Medicaid, Medicare, [place] Health Choice, Veterans' Benefits)  Household income should be no more than 200% of the poverty level The clinic cannot treat you if you are pregnant or think you are pregnant  Sexually transmitted diseases are not treated at the clinic.    Dental Care: Organization         Address  Phone  Notes  [facility] Department of [facility] [address] [place], [place] [phone] Accepts children up to age 49 who are enrolled in [place] or [place] Health Choice; pregnant women with a Medicaid card; and children who have applied for Medicaid or [place] Health Choice, but were declined, whose parents can pay a reduced fee at time of service.  [facility] Department of [facility]  [address], [place] [phone] Accepts children up to age 70 who are enrolled in [place] or [place] Health Choice; pregnant women with a Medicaid card; and children who have applied for Medicaid or [place] Health Choice, but were declined, whose parents can pay a reduced fee at time of  service.  [facility]  [address] [place], [place] [phone] Patients are seen by appointment only. Walk-ins are not accepted. [facility] will see patients 72 years of age and older. Monday - Tuesday (8am-5pm) Most Wednesdays (8:30-5pm) $30 per visit, cash only  [facility] Adult Dental Access PROGRAM  [address], [facility] [phone] Patients are seen by appointment only. Walk-ins are not accepted. [facility] will see patients 13 years of age and older. One Wednesday Evening (Monthly: Volunteer Based).  $30 per visit, cash only  [employer]  [phone] for adults; Children under age 40, call Graduate Pediatric Dentistry at [phone]. Children aged 26-14, please call [phone] to request a pediatric application.  Dental services are provided in all areas of dental care including fillings, crowns and bridges, complete and partial dentures, implants, gum treatment, root canals, and extractions. Preventive care is also provided. Treatment is provided to both adults and children. Patients are selected via a lottery and there is often a waiting list.   [facility] [address], [place]  [phone] [email or web address]   [facility] [address] [place], [place] [phone], Ext. [phone] Second and Fourth Thursday of each month, opens at 6:30 AM; Clinic ends at 9 AM.  Patients are seen on a first-come first-served basis, and a limited number are seen during each clinic.   [facility]  [address] Ether Griffins [place], [place] [phone]   Eligibility Requirements You must have lived in [place], [place], or [place] counties for at least the last three months.   You cannot be eligible for state or federal sponsored [place], including [employer], [place], or [employer].   You generally cannot be eligible for healthcare insurance through your employer.     How to apply: Eligibility screenings are held every Tuesday and Wednesday afternoon from 1:00 pm until 4:00 pm. You do not need an appointment for the interview!  [facility] [address] [place], [place] [phone]   [facility] Health Department  [phone]   [facility] Health Department  [phone]   [facility] Health Department  [phone]  Behavioral Health Resources in the Community: Intensive Outpatient Programs Organization         Address  Phone  Notes  [facility] [address] [place], [place] [phone]   [facility] Outpatient [address] [place], [place] [phone]   [facility] [address], [place], [place]  [phone]   [facility] Mental Health [address]  [place], [place] [phone] or [phone]   Substance Abuse Resources Organization         Address  Phone  Notes  [facility]  [phone]   [facility]  [phone]   [facility] [place]  [phone]   Floydene Flock  [phone]   Residential & Outpatient Substance Abuse Program  [phone]   Psychological Services Organization         Address  Phone  Notes  [facility] Behavioral Health  [phone]   [facility]  [phone]   [facility] Mental Health [address], [place] [phone] or [phone]    Mobile Crisis Teams Organization         Address  Phone  Notes  [facility], Mobile Crisis Care Unit  [phone]   [facility]  [address] [place], [place] [phone]   Doristine Locks [address] [place] [phone]    Self-Help/Support Groups Organization         Address  Phone             Notes  [facility] [place] - variety of support groups  336- I7437963 Call for more information  Narcotics Anonymous (NA), [facility] [address]  Dr, [employer] [place]  2 meetings at this location   Statistician         Address  Phone  Notes  [facility] [address],    [place]  [phone]   [facility]  [address] [place] [address], [place], [place] [phone]   [facility] Treatment [facility] [address] [place], [place] [phone] Admissions: 8am-3pm M-F  [facility] [address]    [place], [place] [phone]   [facility] [address] [place], [place], [place] [phone]   [facility] [address],  [place], [place] [phone]   [facility] - Intensive Outpatient [address] Laurell Joseph[address], [place], [place] [phone]   [facility] ([facility] [address] [place]  [place], [place] [phone] or [phone]   [facility] ([facility]) [address], [place], [place] [phone] Accepts Medicaid  [facility] [address],  [place] [phone] Substance Abuse/Addiction Treatment   [facility] Organization         Address  Phone  Notes  [facility]  [phone]   Angie Fava, PhD [address] Ervin Knack [place], [place]   [phone] or [phone]   [facility] Behavioral   [address] [place], [place] [phone]   [facility] [address], [place], [place] [phone] Insurance/Medicaid/sponsorship through [employer] and Families [address]                                    [place], [place] [phone] Therapy/tele-psych/case  [facility] [address]   [place],  [place] [phone]    Dr. Lolly Mustache  [phone]   [facility]  [employer] [facility] Dept. 1) [address] [place] 2) [address], [place] 3)  [address], [place] [phone]  [phone]   [facility] Child Abuse  Hotline [phone] or [phone] (After Hours)

## 2015-06-28 NOTE — ED Provider Notes (Signed)
CSN: [id number]     Arrival date & time 06/28/15  1025 History   First MD Initiated Contact with Patient 06/28/15 1048     Chief Complaint  Patient presents with  . Abdominal Pain   (Consider location/radiation/quality/duration/timing/severity/associated sxs/prior Treatment) HPI  25 y.o. female presents to the Emergency Department today complaining of chronic abdominal pain for the past 2 years. She states that the pain usually comes and goes throughout the day (roughly 2-3 times) in a generalized location. Pt notes that this morning her abdominal pain was much worse when she woke up. She gives it an 8.5/10 on the pain scale. Had some nausea with no vomiting today. Reports irregular bowel movements with diarrhea. No melena or hematochezia. Reports that her LMP was in Nov and has not had any menses as of today. She is sexually active and not on any birth control. Home pregnancy test was negative.     Pt does not have a PCP and has not seen any providers since a GYN visit for a possible Ovarian Cyst in 2013   Past Medical History  Diagnosis Date  . Headache(784.0)   . Concussion     from basketball in high school    History reviewed. No pertinent past surgical history. Family History  Problem Relation Age of Onset  . Diabetes Mother   . Diabetes Maternal Grandmother    Social History  Substance Use Topics  . Smoking status: Current Some Day Smoker -- 0.02 packs/day    Types: Cigarettes  . Smokeless tobacco: Never Used  . Alcohol Use: No   OB History    No data available     Review of Systems  Constitutional: Negative for fever, chills, diaphoresis and fatigue.  HENT: Negative for congestion, sinus pressure, sore throat and tinnitus.   Eyes: Negative for visual disturbance.  Respiratory: Negative for cough and shortness of breath.   Cardiovascular: Negative for chest pain.  Gastrointestinal: Positive for nausea, abdominal pain and diarrhea. Negative for vomiting and  constipation.  Endocrine: Negative for cold intolerance and heat intolerance.  Genitourinary: Positive for menstrual problem. Negative for dysuria, hematuria, vaginal bleeding, vaginal discharge, difficulty urinating, vaginal pain and pelvic pain.  Musculoskeletal: Negative for back pain.  Skin: Negative for color change.  Neurological: Negative for dizziness, syncope, weakness, numbness and headaches.    Allergies  Peanut-containing drug products  Home Medications   Prior to Admission medications   Medication Sig Start Date End Date Taking? Authorizing Provider  TRINESSA, 28, 0.18/0.215/0.25 MG-35 MCG tablet TAKE 1 TABLET BY MOUTH DAILY    Myra C Dove, MD   BP 109/75 mmHg  Pulse 74  Temp(Src) 99 F (37.2 C) (Oral)  Resp 20  SpO2 100%  LMP 05/14/2015   Physical Exam  Constitutional: She is oriented to person, place, and time. Vital signs are normal. She appears well-developed and well-nourished.  HENT:  Head: Normocephalic and atraumatic.  Right Ear: Hearing, tympanic membrane, external ear and ear canal normal.  Left Ear: Hearing, tympanic membrane, external ear and ear canal normal.  Nose: Nose normal.  Mouth/Throat: Uvula is midline, oropharynx is clear and moist and mucous membranes are normal.  Eyes: Conjunctivae, EOM and lids are normal. Pupils are equal, round, and reactive to light.  Neck: Trachea normal and normal range of motion. Neck supple.  Cardiovascular: Normal rate, regular rhythm, S1 normal, S2 normal, normal heart sounds, intact distal pulses and normal pulses.   Pulmonary/Chest: Effort normal and breath sounds normal.  Abdominal:  Soft. Normal appearance and bowel sounds are normal. There is tenderness in the suprapubic area. There is no rebound, no guarding, no CVA tenderness, no tenderness at McBurney's point and negative Murphy's sign.  Genitourinary: Vagina normal. Pelvic exam was performed with patient supine. There is no rash, tenderness, lesion or injury  on the right labia. There is no rash, tenderness, lesion or injury on the left labia. Cervix exhibits discharge (white). Cervix exhibits no motion tenderness. Right adnexum displays no mass. Left adnexum displays tenderness. Left adnexum displays no mass. No tenderness or bleeding in the vagina. No foreign body around the vagina.  Chaperone was present during pelvic examination   Lymphadenopathy:    She has no cervical adenopathy.  Neurological: She is alert and oriented to person, place, and time.  Skin: Skin is warm and dry.  Psychiatric: She has a normal mood and affect. Her speech is normal and behavior is normal. Thought content normal.  Vitals reviewed.  ED Course  Procedures (including critical care time) Labs Review Labs Reviewed  WET PREP, GENITAL - Abnormal; Notable for the following:    Yeast Wet Prep HPF POC PRESENT (*)    WBC, Wet Prep HPF POC FEW (*)    All other components within normal limits  CBC - Abnormal; Notable for the following:    Hemoglobin 11.4 (*)    HCT 35.5 (*)    All other components within normal limits  LIPASE, BLOOD  COMPREHENSIVE METABOLIC PANEL  URINALYSIS, ROUTINE W REFLEX MICROSCOPIC (NOT AT [facility])  I-STAT BETA HCG BLOOD, ED ([facility], [facility], [facility] ONLY)  GC/CHLAMYDIA PROBE AMP ([place]) NOT AT [facility]   Imaging Review [place] Transvaginal Non-ob  06/28/2015  CLINICAL DATA:  Pelvic pain for 3 weeks, suprapubic. LMP November 2016 EXAM: TRANSABDOMINAL AND TRANSVAGINAL ULTRASOUND OF PELVIS TECHNIQUE: Both transabdominal and transvaginal ultrasound examinations of the pelvis were performed. Transabdominal technique was performed for global imaging of the pelvis including uterus, ovaries, adnexal regions, and pelvic cul-de-sac. It was necessary to proceed with endovaginal exam following the transabdominal exam to visualize the ovaries and endometrium. COMPARISON:  None FINDINGS: Uterus Measurements: 8 x 3 x 4 cm. No fibroids or other mass visualized. Endometrium Thickness:  8 mm.  No focal abnormality visualized. Right ovary Measurements: 32 x 20 x 28 mm. Dominant follicle/ corpus luteal cyst measuring 19 mm. Left ovary Measurements: 36 x 15 x 26 mm. Normal appearance/no adnexal mass. Other findings No abnormal free fluid. IMPRESSION: Normal pelvic ultrasound. Electronically Signed   By: Marnee SpringJonathon  Watts M.D.   On: 06/28/2015 13:01   [place] Pelvis Complete  06/28/2015  CLINICAL DATA:  Pelvic pain for 3 weeks, suprapubic. LMP November 2016 EXAM: TRANSABDOMINAL AND TRANSVAGINAL ULTRASOUND OF PELVIS TECHNIQUE: Both transabdominal and transvaginal ultrasound examinations of the pelvis were performed. Transabdominal technique was performed for global imaging of the pelvis including uterus, ovaries, adnexal regions, and pelvic cul-de-sac. It was necessary to proceed with endovaginal exam following the transabdominal exam to visualize the ovaries and endometrium. COMPARISON:  None FINDINGS: Uterus Measurements: 8 x 3 x 4 cm. No fibroids or other mass visualized. Endometrium Thickness: 8 mm.  No focal abnormality visualized. Right ovary Measurements: 32 x 20 x 28 mm. Dominant follicle/ corpus luteal cyst measuring 19 mm. Left ovary Measurements: 36 x 15 x 26 mm. Normal appearance/no adnexal mass. Other findings No abnormal free fluid. IMPRESSION: Normal pelvic ultrasound. Electronically Signed   By: Marnee SpringJonathon  Watts M.D.   On: 06/28/2015 13:01   I have  personally reviewed and evaluated these images and lab results as part of my medical decision-making.   EKG Interpretation None     MDM  I have reviewed relevant laboratory values. I have reviewed relevant imaging studies. I have reviewed the relevant previous healthcare records. I obtained HPI from historian. Patient discussed with supervising physician  ED Course: U/S Pelvis- Evaluate for Cyst Rupture/Torsion   Assessment: 24yF with hx of chronic abdominal pain for the past 2 years. Currently pt VS stable and is in NAD. Pt able to  ambulate and tolerate PO. 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 Will evaluate pancrease and gallbladder as well as do a pelvic exam/UA to evaluate for PID. Will ultrasound to evaluate for torsion/ovarian cyst.  Vaginal Swab shows Yeast- will dc with Diflucan   Disposition/Plan:  Give 250mg  PO Diflucan and DC home Additional Verbal discharge instructions given and discussed with patient.  Pt Instructed to f/u with PCP in the next 48 hours for evaluation and treatment of symptoms and potential follow up with GYN for management. Patient acknowledges and agrees with plan.   Supervising Physician Leta Baptist, MD    Final diagnoses:  Yeast infection of the vagina  Generalized abdominal pain      Audry Pili, PA-C 06/28/15 1834  Leta Baptist, MD 07/01/15 [phone]

## 2015-06-29 LAB — GC/CHLAMYDIA PROBE AMP (~~LOC~~) NOT AT ARMC
CHLAMYDIA, DNA PROBE: NEGATIVE
NEISSERIA GONORRHEA: NEGATIVE

## 2015-09-01 ENCOUNTER — Emergency Department (HOSPITAL_COMMUNITY)
Admission: EM | Admit: 2015-09-01 | Discharge: 2015-09-01 | Disposition: A | Discharge status: Home / Self Care | Payer: Medicaid Other | Attending: Emergency Medicine | Admitting: Emergency Medicine

## 2015-09-01 ENCOUNTER — Encounter (HOSPITAL_COMMUNITY): Payer: Self-pay | Admitting: Emergency Medicine

## 2015-09-01 DIAGNOSIS — Z87828 Personal history of other (healed) physical injury and trauma: Secondary | ICD-10-CM | POA: Insufficient documentation

## 2015-09-01 DIAGNOSIS — R51 Headache: Secondary | ICD-10-CM | POA: Insufficient documentation

## 2015-09-01 DIAGNOSIS — F419 Anxiety disorder, unspecified: Secondary | ICD-10-CM | POA: Insufficient documentation

## 2015-09-01 DIAGNOSIS — R1010 Upper abdominal pain, unspecified: Secondary | ICD-10-CM | POA: Insufficient documentation

## 2015-09-01 DIAGNOSIS — Z3202 Encounter for pregnancy test, result negative: Secondary | ICD-10-CM | POA: Insufficient documentation

## 2015-09-01 DIAGNOSIS — R112 Nausea with vomiting, unspecified: Secondary | ICD-10-CM | POA: Insufficient documentation

## 2015-09-01 DIAGNOSIS — F17210 Nicotine dependence, cigarettes, uncomplicated: Secondary | ICD-10-CM | POA: Insufficient documentation

## 2015-09-01 LAB — COMPREHENSIVE METABOLIC PANEL
ALT: 14 U/L (ref 14–54)
ANION GAP: 13 (ref 5–15)
AST: 17 U/L (ref 15–41)
Albumin: 3.8 g/dL (ref 3.5–5.0)
Alkaline Phosphatase: 53 U/L (ref 38–126)
BUN: 11 mg/dL (ref 6–20)
CHLORIDE: 106 mmol/L (ref 101–111)
CO2: 23 mmol/L (ref 22–32)
CREATININE: 0.71 mg/dL (ref 0.44–1.00)
Calcium: 9.4 mg/dL (ref 8.9–10.3)
Glucose, Bld: 91 mg/dL (ref 65–99)
POTASSIUM: 4.1 mmol/L (ref 3.5–5.1)
Sodium: 142 mmol/L (ref 135–145)
Total Bilirubin: 0.4 mg/dL (ref 0.3–1.2)
Total Protein: 6.7 g/dL (ref 6.5–8.1)

## 2015-09-01 LAB — CBC
HCT: 35 % — ABNORMAL LOW (ref 36.0–46.0)
HEMOGLOBIN: 11.5 g/dL — AB (ref 12.0–15.0)
MCH: 28.3 pg (ref 26.0–34.0)
MCHC: 32.9 g/dL (ref 30.0–36.0)
MCV: 86 fL (ref 78.0–100.0)
PLATELETS: 259 10*3/uL (ref 150–400)
RBC: 4.07 MIL/uL (ref 3.87–5.11)
RDW: 13.8 % (ref 11.5–15.5)
WBC: 6.3 10*3/uL (ref 4.0–10.5)

## 2015-09-01 LAB — URINALYSIS, ROUTINE W REFLEX MICROSCOPIC
Bilirubin Urine: NEGATIVE
GLUCOSE, UA: NEGATIVE mg/dL
Ketones, ur: NEGATIVE mg/dL
LEUKOCYTES UA: NEGATIVE
Nitrite: POSITIVE — AB
PROTEIN: NEGATIVE mg/dL
SPECIFIC GRAVITY, URINE: 1.013 (ref 1.005–1.030)
pH: 6.5 (ref 5.0–8.0)

## 2015-09-01 LAB — URINE MICROSCOPIC-ADD ON: WBC UA: NONE SEEN WBC/hpf (ref 0–5)

## 2015-09-01 LAB — I-STAT BETA HCG BLOOD, ED (MC, WL, AP ONLY)

## 2015-09-01 LAB — LIPASE, BLOOD: LIPASE: 30 U/L (ref 11–51)

## 2015-09-01 MED ORDER — LORAZEPAM 2 MG/ML IJ SOLN
1.0000 mg | Freq: Once | INTRAMUSCULAR | Status: AC
  Administered 2015-09-01: 1 mg via INTRAVENOUS
  Filled 2015-09-01: qty 1

## 2015-09-01 MED ORDER — SODIUM CHLORIDE 0.9 % IV BOLUS (SEPSIS)
1000.0000 mL | Freq: Once | INTRAVENOUS | Status: AC
  Administered 2015-09-01: 1000 mL via INTRAVENOUS

## 2015-09-01 MED ORDER — FAMOTIDINE 20 MG PO TABS
20.0000 mg | ORAL_TABLET | Freq: Once | ORAL | Status: AC
  Administered 2015-09-01: 20 mg via ORAL
  Filled 2015-09-01: qty 1

## 2015-09-01 MED ORDER — ACETAMINOPHEN 500 MG PO TABS
1000.0000 mg | ORAL_TABLET | Freq: Once | ORAL | Status: AC
  Administered 2015-09-01: 1000 mg via ORAL
  Filled 2015-09-01: qty 2

## 2015-09-01 MED ORDER — ONDANSETRON 8 MG PO TBDP: 8.0000 mg | ORAL_TABLET | Freq: Three times a day (TID) | ORAL | Status: AC | PRN

## 2015-09-01 MED ORDER — ALUM & MAG HYDROXIDE-SIMETH 200-200-20 MG/5ML PO SUSP
30.0000 mL | Freq: Once | ORAL | Status: AC
  Administered 2015-09-01: 30 mL via ORAL
  Filled 2015-09-01: qty 30

## 2015-09-01 MED ORDER — ONDANSETRON HCL 4 MG/2ML IJ SOLN
4.0000 mg | Freq: Once | INTRAMUSCULAR | Status: AC
  Administered 2015-09-01: 4 mg via INTRAVENOUS
  Filled 2015-09-01: qty 2

## 2015-09-01 NOTE — ED Provider Notes (Signed)
CSN: [id number]     Arrival date & time 09/01/15  4098 History   First MD Initiated Contact with Patient 09/01/15 0759     Chief Complaint  Patient presents with  . Headache  . Abdominal Pain  . Emesis     (Consider location/radiation/quality/duration/timing/severity/associated sxs/prior Treatment) Patient is a 25 y.o. female presenting with headaches, abdominal pain, and vomiting. The history is provided by the patient.  Headache Associated symptoms: abdominal pain, nausea and vomiting   Associated symptoms: no back pain, no diarrhea, no fever, no neck pain and no sore throat   Abdominal Pain Associated symptoms: nausea and vomiting   Associated symptoms: no chest pain, no chills, no constipation, no diarrhea, no dysuria, no fever, no shortness of breath and no sore throat   Emesis Associated symptoms: abdominal pain and headaches   Associated symptoms: no chills, no diarrhea and no sore throat   Patient c/o acute onset nausea and vomiting yesterday. Several episodes. Emesis is not bilious, and non bloody. Intermittent, diffuse abd cramping pain, mild - no constant and/or focal abd pain. No diarrhea. Had normal bm today. No known bad food ingestion, or ill contacts. No prior abd surgery. No hx pud, pancreatitis, gallstones, IBS or recurrent abd pain. No fever or chills. No dysuria. No cough or uri c/o. No chest pain.       Past Medical History  Diagnosis Date  . Headache(784.0)   . Concussion     from basketball in high school    History reviewed. No pertinent past surgical history. Family History  Problem Relation Age of Onset  . Diabetes Mother   . Diabetes Maternal Grandmother    Social History  Substance Use Topics  . Smoking status: Current Some Day Smoker -- 0.02 packs/day    Types: Cigarettes  . Smokeless tobacco: Never Used  . Alcohol Use: No   OB History    No data available     Review of Systems  Constitutional: Negative for fever and chills.  HENT:  Negative for sore throat.   Eyes: Negative for redness.  Respiratory: Negative for shortness of breath.   Cardiovascular: Negative for chest pain.  Gastrointestinal: Positive for nausea, vomiting and abdominal pain. Negative for diarrhea and constipation.  Genitourinary: Negative for dysuria and flank pain.  Musculoskeletal: Negative for back pain and neck pain.  Skin: Negative for rash.  Neurological: Positive for headaches.  Hematological: Does not bruise/bleed easily.  Psychiatric/Behavioral: Negative for confusion.      Allergies  Peanut-containing drug products  Home Medications   Prior to Admission medications   Medication Sig Start Date End Date Taking? Authorizing Provider  TRINESSA, 28, 0.18/0.215/0.25 MG-35 MCG tablet TAKE 1 TABLET BY MOUTH DAILY Patient not taking: Reported on 06/28/2015    Allie Bossier, MD   BP 105/72 mmHg  Pulse 77  Temp(Src) 98.3 F (36.8 C) (Oral)  Resp 17  Wt 84.687 kg  SpO2 100%  LMP 07/07/2015 Physical Exam  Constitutional: She is oriented to person, place, and time. She appears well-developed and well-nourished. No distress.  HENT:  Mouth/Throat: Oropharynx is clear and moist.  Eyes: Conjunctivae are normal. No scleral icterus.  Neck: Neck supple. No tracheal deviation present.  Cardiovascular: Normal rate, regular rhythm, normal heart sounds and intact distal pulses.  Exam reveals no gallop and no friction rub.   No murmur heard. Pulmonary/Chest: Effort normal and breath sounds normal. No respiratory distress.  Abdominal: Soft. Normal appearance and bowel sounds are normal. She exhibits  no distension. There is no tenderness.  Genitourinary:  No cva tenderness  Musculoskeletal: She exhibits no edema or tenderness.  Neurological: She is alert and oriented to person, place, and time.  Skin: Skin is warm and dry. No rash noted. She is not diaphoretic.  Psychiatric:  Mildly anxious.   Nursing note and vitals reviewed.   ED Course   Procedures (including critical care time) Labs Review  Results for orders placed or performed during the hospital encounter of 09/01/15  Lipase, blood  Result Value Ref Range   Lipase 30 11 - 51 U/L  Comprehensive metabolic panel  Result Value Ref Range   Sodium 142 135 - 145 mmol/L   Potassium 4.1 3.5 - 5.1 mmol/L   Chloride 106 101 - 111 mmol/L   CO2 23 22 - 32 mmol/L   Glucose, Bld 91 65 - 99 mg/dL   BUN 11 6 - 20 mg/dL   Creatinine, Ser 9.600.71 0.44 - 1.00 mg/dL   Calcium 9.4 8.9 - 45.410.3 mg/dL   Total Protein 6.7 6.5 - 8.1 g/dL   Albumin 3.8 3.5 - 5.0 g/dL   AST 17 15 - 41 U/L   ALT 14 14 - 54 U/L   Alkaline Phosphatase 53 38 - 126 U/L   Total Bilirubin 0.4 0.3 - 1.2 mg/dL   GFR calc non Af Amer >60 >60 mL/min   GFR calc Af Amer >60 >60 mL/min   Anion gap 13 5 - 15  CBC  Result Value Ref Range   WBC 6.3 4.0 - 10.5 K/uL   RBC 4.07 3.87 - 5.11 MIL/uL   Hemoglobin 11.5 (L) 12.0 - 15.0 g/dL   HCT 09.835.0 (L) 11.936.0 - 14.746.0 %   MCV 86.0 78.0 - 100.0 fL   MCH 28.3 26.0 - 34.0 pg   MCHC 32.9 30.0 - 36.0 g/dL   RDW 82.913.8 56.211.5 - 13.015.5 %   Platelets 259 150 - 400 K/uL  Urinalysis, Routine w reflex microscopic (not at [facility])  Result Value Ref Range   Color, Urine YELLOW YELLOW   APPearance CLOUDY (A) CLEAR   Specific Gravity, Urine 1.013 1.005 - 1.030   pH 6.5 5.0 - 8.0   Glucose, UA NEGATIVE NEGATIVE mg/dL   Hgb urine dipstick TRACE (A) NEGATIVE   Bilirubin Urine NEGATIVE NEGATIVE   Ketones, ur NEGATIVE NEGATIVE mg/dL   Protein, ur NEGATIVE NEGATIVE mg/dL   Nitrite POSITIVE (A) NEGATIVE   Leukocytes, UA NEGATIVE NEGATIVE  Urine microscopic-add on  Result Value Ref Range   Squamous Epithelial / LPF 6-30 (A) NONE SEEN   WBC, UA NONE SEEN 0 - 5 WBC/hpf   RBC / HPF 0-5 0 - 5 RBC/hpf   Bacteria, UA MANY (A) NONE SEEN  I-Stat beta hCG blood, ED ([facility], [facility], [facility] only)  Result Value Ref Range   I-stat hCG, quantitative <5.0 <5 mIU/mL   Comment 3              I have personally  reviewed and evaluated these lab results as part of my medical decision-making.   MDM   Iv ns bolus. zofran iv.  Reviewed nursing notes and prior charts for additional history.   pepcid and maalox po.  Tylenol po.  Po fluids - pt tolerates.  No recurrent vomiting.   Recheck abd soft nt.   pts symptoms and exam most c/w viral syndrome. abd exam currently benign.  Return precautions provided.      Cathren LaineKevin Sailor Haughn, MD 09/01/15 1044

## 2015-09-01 NOTE — ED Notes (Addendum)
Pt reports yesterday morning waking up with headache, abdominal pain and vomiting. Pt denies Diarrhea. Pt denies change in diet or being exposed to others with illness. Pt last menstrual period was in January. Pt took home pregnancy test that was negative.

## 2015-09-01 NOTE — Discharge Instructions (Signed)
It was our pleasure to provide your ER care today - we hope that you feel better.  Rest. Drink plenty of fluids.  Take zofran as need for nausea.  You may also try take pepcid, maalox or gas-x as need for stomach upset.  Follow up with primary care doctor (or here if unable to be seen by primary care doctor) in the next 1-2 days for recheck if symptoms fail to improve/resolve.  Return to ER right away if worse, new symptoms, persistent vomiting, severe abdominal pain, high fevers, other concern.     Nausea and Vomiting Nausea is a sick feeling that often comes before throwing up (vomiting). Vomiting is a reflex where stomach contents come out of your mouth. Vomiting can cause severe loss of body fluids (dehydration). Children and elderly adults can become dehydrated quickly, especially if they also have diarrhea. Nausea and vomiting are symptoms of a condition or disease. It is important to find the cause of your symptoms. CAUSES   Direct irritation of the stomach lining. This irritation can result from increased acid production (gastroesophageal reflux disease), infection, food poisoning, taking certain medicines (such as nonsteroidal anti-inflammatory drugs), alcohol use, or tobacco use.  Signals from the brain.These signals could be caused by a headache, heat exposure, an inner ear disturbance, increased pressure in the brain from injury, infection, a tumor, or a concussion, pain, emotional stimulus, or metabolic problems.  An obstruction in the gastrointestinal tract (bowel obstruction).  Illnesses such as diabetes, hepatitis, gallbladder problems, appendicitis, kidney problems, cancer, sepsis, atypical symptoms of a heart attack, or eating disorders.  Medical treatments such as chemotherapy and radiation.  Receiving medicine that makes you sleep (general anesthetic) during surgery. DIAGNOSIS Your caregiver may ask for tests to be done if the problems do not improve after a few  days. Tests may also be done if symptoms are severe or if the reason for the nausea and vomiting is not clear. Tests may include:  Urine tests.  Blood tests.  Stool tests.  Cultures (to look for evidence of infection).  X-rays or other imaging studies. Test results can help your caregiver make decisions about treatment or the need for additional tests. TREATMENT You need to stay well hydrated. Drink frequently but in small amounts.You may wish to drink water, sports drinks, clear broth, or eat frozen ice pops or gelatin dessert to help stay hydrated.When you eat, eating slowly may help prevent nausea.There are also some antinausea medicines that may help prevent nausea. HOME CARE INSTRUCTIONS   Take all medicine as directed by your caregiver.  If you do not have an appetite, do not force yourself to eat. However, you must continue to drink fluids.  If you have an appetite, eat a normal diet unless your caregiver tells you differently.  Eat a variety of complex carbohydrates (rice, wheat, potatoes, bread), lean meats, yogurt, fruits, and vegetables.  Avoid high-fat foods because they are more difficult to digest.  Drink enough water and fluids to keep your urine clear or pale yellow.  If you are dehydrated, ask your caregiver for specific rehydration instructions. Signs of dehydration may include:  Severe thirst.  Dry lips and mouth.  Dizziness.  Dark urine.  Decreasing urine frequency and amount.  Confusion.  Rapid breathing or pulse. SEEK IMMEDIATE MEDICAL CARE IF:   You have blood or brown flecks (like coffee grounds) in your vomit.  You have black or bloody stools.  You have a severe headache or stiff neck.  You are  confused.  You have severe abdominal pain.  You have chest pain or trouble breathing.  You do not urinate at least once every 8 hours.  You develop cold or clammy skin.  You continue to vomit for longer than 24 to 48 hours.  You have a  fever. MAKE SURE YOU:   Understand these instructions.  Will watch your condition.  Will get help right away if you are not doing well or get worse.   This information is not intended to replace advice given to you by your health care provider. Make sure you discuss any questions you have with your health care provider.   Document Released: 06/10/2005 Document Revised: 09/02/2011 Document Reviewed: 11/07/2010 Elsevier Interactive Patient Education 2016 [employer]  Abdominal Pain, Adult Many things can cause abdominal pain. Usually, abdominal pain is not caused by a disease and will improve without treatment. It can often be observed and treated at home. Your health care provider will do a physical exam and possibly order blood tests and X-rays to help determine the seriousness of your pain. However, in many cases, more time must pass before a clear cause of the pain can be found. Before that point, your health care provider may not know if you need more testing or further treatment. HOME CARE INSTRUCTIONS Monitor your abdominal pain for any changes. The following actions may help to alleviate any discomfort you are experiencing:  Only take over-the-counter or prescription medicines as directed by your health care provider.  Do not take laxatives unless directed to do so by your health care provider.  Try a clear liquid diet (broth, tea, or water) as directed by your health care provider. Slowly move to a bland diet as tolerated. SEEK MEDICAL CARE IF:  You have unexplained abdominal pain.  You have abdominal pain associated with nausea or diarrhea.  You have pain when you urinate or have a bowel movement.  You experience abdominal pain that wakes you in the night.  You have abdominal pain that is worsened or improved by eating food.  You have abdominal pain that is worsened with eating fatty foods.  You have a fever. SEEK IMMEDIATE MEDICAL CARE IF:  Your pain does not go  away within 2 hours.  You keep throwing up (vomiting).  Your pain is felt only in portions of the abdomen, such as the right side or the left lower portion of the abdomen.  You pass bloody or black tarry stools. MAKE SURE YOU:  Understand these instructions.  Will watch your condition.  Will get help right away if you are not doing well or get worse.   This information is not intended to replace advice given to you by your health care provider. Make sure you discuss any questions you have with your health care provider.   Document Released: 03/20/2005 Document Revised: 03/01/2015 Document Reviewed: 02/17/2013 Elsevier Interactive Patient Education 2016 [employer]    Viral Gastroenteritis Viral gastroenteritis is also known as stomach flu. This condition affects the stomach and intestinal tract. It can cause sudden diarrhea and vomiting. The illness typically lasts 3 to 8 days. Most people develop an immune response that eventually gets rid of the virus. While this natural response develops, the virus can make you quite ill. CAUSES  Many different viruses can cause gastroenteritis, such as rotavirus or noroviruses. You can catch one of these viruses by consuming contaminated food or water. You may also catch a virus by sharing utensils or other personal items  with an infected person or by touching a contaminated surface. SYMPTOMS  The most common symptoms are diarrhea and vomiting. These problems can cause a severe loss of body fluids (dehydration) and a body salt (electrolyte) imbalance. Other symptoms may include:  Fever.  Headache.  Fatigue.  Abdominal pain. DIAGNOSIS  Your caregiver can usually diagnose viral gastroenteritis based on your symptoms and a physical exam. A stool sample may also be taken to test for the presence of viruses or other infections. TREATMENT  This illness typically goes away on its own. Treatments are aimed at rehydration. The most serious cases  of viral gastroenteritis involve vomiting so severely that you are not able to keep fluids down. In these cases, fluids must be given through an intravenous line (IV). HOME CARE INSTRUCTIONS   Drink enough fluids to keep your urine clear or pale yellow. Drink small amounts of fluids frequently and increase the amounts as tolerated.  Ask your caregiver for specific rehydration instructions.  Avoid:  Foods high in sugar.  Alcohol.  Carbonated drinks.  Tobacco.  Juice.  Caffeine drinks.  Extremely hot or cold fluids.  Fatty, greasy foods.  Too much intake of anything at one time.  Dairy products until 24 to 48 hours after diarrhea stops.  You may consume probiotics. Probiotics are active cultures of beneficial bacteria. They may lessen the amount and number of diarrheal stools in adults. Probiotics can be found in yogurt with active cultures and in supplements.  Wash your hands well to avoid spreading the virus.  Only take over-the-counter or prescription medicines for pain, discomfort, or fever as directed by your caregiver. Do not give aspirin to children. Antidiarrheal medicines are not recommended.  Ask your caregiver if you should continue to take your regular prescribed and over-the-counter medicines.  Keep all follow-up appointments as directed by your caregiver. SEEK IMMEDIATE MEDICAL CARE IF:   You are unable to keep fluids down.  You do not urinate at least once every 6 to 8 hours.  You develop shortness of breath.  You notice blood in your stool or vomit. This may look like coffee grounds.  You have abdominal pain that increases or is concentrated in one small area (localized).  You have persistent vomiting or diarrhea.  You have a fever.  The patient is a child younger than 3 months, and he or she has a fever.  The patient is a child older than 3 months, and he or she has a fever and persistent symptoms.  The patient is a child older than 3 months,  and he or she has a fever and symptoms suddenly get worse.  The patient is a baby, and he or she has no tears when crying. MAKE SURE YOU:   Understand these instructions.  Will watch your condition.  Will get help right away if you are not doing well or get worse.   This information is not intended to replace advice given to you by your health care provider. Make sure you discuss any questions you have with your health care provider.   Document Released: 06/10/2005 Document Revised: 09/02/2011 Document Reviewed: 03/27/2011 Elsevier Interactive Patient Education [employer]

## 2015-11-05 ENCOUNTER — Encounter (HOSPITAL_COMMUNITY): Payer: Self-pay | Admitting: *Deleted

## 2015-11-05 ENCOUNTER — Emergency Department (HOSPITAL_COMMUNITY)
Admission: EM | Admit: 2015-11-05 | Discharge: 2015-11-05 | Disposition: A | Discharge status: Home / Self Care | Payer: Medicaid Other | Attending: Emergency Medicine | Admitting: Emergency Medicine

## 2015-11-05 DIAGNOSIS — Z3201 Encounter for pregnancy test, result positive: Secondary | ICD-10-CM | POA: Insufficient documentation

## 2015-11-05 DIAGNOSIS — Z87828 Personal history of other (healed) physical injury and trauma: Secondary | ICD-10-CM | POA: Insufficient documentation

## 2015-11-05 DIAGNOSIS — F17210 Nicotine dependence, cigarettes, uncomplicated: Secondary | ICD-10-CM | POA: Insufficient documentation

## 2015-11-05 DIAGNOSIS — Z3490 Encounter for supervision of normal pregnancy, unspecified, unspecified trimester: Secondary | ICD-10-CM

## 2015-11-05 LAB — I-STAT BETA HCG BLOOD, ED (MC, WL, AP ONLY): I-stat hCG, quantitative: >2000 m[IU]/mL — ABNORMAL HIGH (ref <5)

## 2015-11-05 MED ORDER — PRENATAL COMPLETE 14-0.4 MG PO TABS: 1.0000 | ORAL_TABLET | Freq: Every day | ORAL | Status: AC

## 2015-11-05 NOTE — ED Notes (Signed)
Pt verbalized understanding of discharge instructions and follow up care

## 2015-11-05 NOTE — Discharge Instructions (Signed)
First Trimester of Pregnancy The first trimester of pregnancy is from week 1 until the end of week 12 (months 1 through 3). A week after a sperm fertilizes an egg, the egg will implant on the wall of the uterus. This embryo will begin to develop into a baby. Genes from you and your partner are forming the baby. The female genes determine whether the baby is a boy or a girl. At 6-8 weeks, the eyes and face are formed, and the heartbeat can be seen on ultrasound. At the end of 12 weeks, all the baby's organs are formed.  Now that you are pregnant, you will want to do everything you can to have a healthy baby. Two of the most important things are to get good prenatal care and to follow your health care provider's instructions. Prenatal care is all the medical care you receive before the baby's birth. This care will help prevent, find, and treat any problems during the pregnancy and childbirth. BODY CHANGES Your body goes through many changes during pregnancy. The changes vary from woman to woman.   You may gain or lose a couple of pounds at first.  You may feel sick to your stomach (nauseous) and throw up (vomit). If the vomiting is uncontrollable, call your health care provider.  You may tire easily.  You may develop headaches that can be relieved by medicines approved by your health care provider.  You may urinate more often. Painful urination may mean you have a bladder infection.  You may develop heartburn as a result of your pregnancy.  You may develop constipation because certain hormones are causing the muscles that push waste through your intestines to slow down.  You may develop hemorrhoids or swollen, bulging veins (varicose veins).  Your breasts may begin to grow larger and become tender. Your nipples may stick out more, and the tissue that surrounds them (areola) may become darker.  Your gums may bleed and may be sensitive to brushing and flossing.  Dark spots or blotches (chloasma,  mask of pregnancy) may develop on your face. This will likely fade after the baby is born.  Your menstrual periods will stop.  You may have a loss of appetite.  You may develop cravings for certain kinds of food.  You may have changes in your emotions from day to day, such as being excited to be pregnant or being concerned that something may go wrong with the pregnancy and baby.  You may have more vivid and strange dreams.  You may have changes in your hair. These can include thickening of your hair, rapid growth, and changes in texture. Some women also have hair loss during or after pregnancy, or hair that feels dry or thin. Your hair will most likely return to normal after your baby is born. WHAT TO EXPECT AT YOUR PRENATAL VISITS During a routine prenatal visit:  You will be weighed to make sure you and the baby are growing normally.  Your blood pressure will be taken.  Your abdomen will be measured to track your baby's growth.  The fetal heartbeat will be listened to starting around week 10 or 12 of your pregnancy.  Test results from any previous visits will be discussed. Your health care provider may ask you:  How you are feeling.  If you are feeling the baby move.  If you have had any abnormal symptoms, such as leaking fluid, bleeding, severe headaches, or abdominal cramping.  If you are using any tobacco products,   including cigarettes, chewing tobacco, and electronic cigarettes.  If you have any questions. Other tests that may be performed during your first trimester include:  Blood tests to find your blood type and to check for the presence of any previous infections. They will also be used to check for low iron levels (anemia) and Rh antibodies. Later in the pregnancy, blood tests for diabetes will be done along with other tests if problems develop.  Urine tests to check for infections, diabetes, or protein in the urine.  An ultrasound to confirm the proper growth  and development of the baby.  An amniocentesis to check for possible genetic problems.  Fetal screens for spina bifida and Down syndrome.  You may need other tests to make sure you and the baby are doing well.  HIV (human immunodeficiency virus) testing. Routine prenatal testing includes screening for HIV, unless you choose not to have this test. HOME CARE INSTRUCTIONS  Medicines  Follow your health care provider's instructions regarding medicine use. Specific medicines may be either safe or unsafe to take during pregnancy.  Take your prenatal vitamins as directed.  If you develop constipation, try taking a stool softener if your health care provider approves. Diet  Eat regular, well-balanced meals. Choose a variety of foods, such as meat or vegetable-based protein, fish, milk and low-fat dairy products, vegetables, fruits, and whole grain breads and cereals. Your health care provider will help you determine the amount of weight gain that is right for you.  Avoid raw meat and uncooked cheese. These carry germs that can cause birth defects in the baby.  Eating four or five small meals rather than three large meals a day may help relieve nausea and vomiting. If you start to feel nauseous, eating a few soda crackers can be helpful. Drinking liquids between meals instead of during meals also seems to help nausea and vomiting.  If you develop constipation, eat more high-fiber foods, such as fresh vegetables or fruit and whole grains. Drink enough fluids to keep your urine clear or pale yellow. Activity and Exercise  Exercise only as directed by your health care provider. Exercising will help you:  Control your weight.  Stay in shape.  Be prepared for labor and delivery.  Experiencing pain or cramping in the lower abdomen or low back is a good sign that you should stop exercising. Check with your health care provider before continuing normal exercises.  Try to avoid standing for long  periods of time. Move your legs often if you must stand in one place for a long time.  Avoid heavy lifting.  Wear low-heeled shoes, and practice good posture.  You may continue to have sex unless your health care provider directs you otherwise. Relief of Pain or Discomfort  Wear a good support bra for breast tenderness.   Take warm sitz baths to soothe any pain or discomfort caused by hemorrhoids. Use hemorrhoid cream if your health care provider approves.   Rest with your legs elevated if you have leg cramps or low back pain.  If you develop varicose veins in your legs, wear support hose. Elevate your feet for 15 minutes, 3-4 times a day. Limit salt in your diet. Prenatal Care  Schedule your prenatal visits by the twelfth week of pregnancy. They are usually scheduled monthly at first, then more often in the last 2 months before delivery.  Write down your questions. Take them to your prenatal visits.  Keep all your prenatal visits as directed by your   health care provider. Safety  Wear your seat belt at all times when driving.  Make a list of emergency phone numbers, including numbers for family, friends, the hospital, and police and fire departments. General Tips  Ask your health care provider for a referral to a local prenatal education class. Begin classes no later than at the beginning of month 6 of your pregnancy.  Ask for help if you have counseling or nutritional needs during pregnancy. Your health care provider can offer advice or refer you to specialists for help with various needs.  Do not use hot tubs, steam rooms, or saunas.  Do not douche or use tampons or scented sanitary pads.  Do not cross your legs for long periods of time.  Avoid cat litter boxes and soil used by cats. These carry germs that can cause birth defects in the baby and possibly loss of the fetus by miscarriage or stillbirth.  Avoid all smoking, herbs, alcohol, and medicines not prescribed by  your health care provider. Chemicals in these affect the formation and growth of the baby.  Do not use any tobacco products, including cigarettes, chewing tobacco, and electronic cigarettes. If you need help quitting, ask your health care provider. You may receive counseling support and other resources to help you quit.  Schedule a dentist appointment. At home, brush your teeth with a soft toothbrush and be gentle when you floss. SEEK MEDICAL CARE IF:   You have dizziness.  You have mild pelvic cramps, pelvic pressure, or nagging pain in the abdominal area.  You have persistent nausea, vomiting, or diarrhea.  You have a bad smelling vaginal discharge.  You have pain with urination.  You notice increased swelling in your face, hands, legs, or ankles. SEEK IMMEDIATE MEDICAL CARE IF:   You have a fever.  You are leaking fluid from your vagina.  You have spotting or bleeding from your vagina.  You have severe abdominal cramping or pain.  You have rapid weight gain or loss.  You vomit blood or material that looks like coffee grounds.  You are exposed to German measles and have never had them.  You are exposed to fifth disease or chickenpox.  You develop a severe headache.  You have shortness of breath.  You have any kind of trauma, such as from a fall or a car accident.   This information is not intended to replace advice given to you by your health care provider. Make sure you discuss any questions you have with your health care provider.   Document Released: 06/04/2001 Document Revised: 07/01/2014 Document Reviewed: 04/20/2013 Elsevier Interactive Patient Education 2016 [employer] Inc[employer]  

## 2015-11-05 NOTE — ED Provider Notes (Signed)
CSN: [id number]     Arrival date & time 11/05/15  1640 History   First MD Initiated Contact with Patient 11/05/15 1936     Chief Complaint  Patient presents with  . Routine Prenatal Visit     (Consider location/radiation/quality/duration/timing/severity/associated sxs/prior Treatment) HPI Kaitlin Duran is a 25 y.o. female with no significant PMH significant for pregnancy evaluation.  Patient reports LMP first week of March.  She reports she was told she couldn't get pregnant because she had a cyst on her ovary.  She is sexually active without protection.  She reports she took a pregnancy test at home and it was positive, so she presents today because she wanted to make sure it was positive.  She also states that she would like an abortion and that she would like to be started on OCPs.  She denies dizziness, lightheadedness, syncope, abdominal pain, vaginal bleeding or spotting, vaginal discharge, or urinary symptoms.  Past Medical History  Diagnosis Date  . Headache(784.0)   . Concussion     from basketball in high school    History reviewed. No pertinent past surgical history. Family History  Problem Relation Age of Onset  . Diabetes Mother   . Diabetes Maternal Grandmother    Social History  Substance Use Topics  . Smoking status: Current Some Day Smoker -- 0.02 packs/day    Types: Cigarettes  . Smokeless tobacco: Never Used  . Alcohol Use: No   OB History    No data available     Review of Systems All other systems negative unless otherwise stated in HPI    Allergies  Peanut-containing drug products  Home Medications   Prior to Admission medications   Medication Sig Start Date End Date Taking? Authorizing Provider  ibuprofen (ADVIL,MOTRIN) 200 MG tablet Take 400 mg by mouth every 6 (six) hours as needed for moderate pain.   Yes Historical Provider, MD  ondansetron (ZOFRAN ODT) 8 MG disintegrating tablet Take 1 tablet (8 mg total) by mouth every 8 (eight) hours as  needed for nausea or vomiting. 09/01/15   Cathren Laine, MD  TRINESSA, 28, 0.18/0.215/0.25 MG-35 MCG tablet TAKE 1 TABLET BY MOUTH DAILY Patient not taking: Reported on 06/28/2015    Allie Bossier, MD   BP 116/87 mmHg  Pulse 102  Temp(Src) 98.8 F (37.1 C) (Oral)  Resp 20  Wt 81.647 kg  SpO2 98%  LMP 08/27/2015 Physical Exam  Constitutional: She is oriented to person, place, and time. She appears well-developed and well-nourished.  Non-toxic appearance. She does not have a sickly appearance. She does not appear ill.  HENT:  Head: Normocephalic and atraumatic.  Mouth/Throat: Oropharynx is clear and moist.  Eyes: Conjunctivae are normal.  Neck: Normal range of motion. Neck supple.  Cardiovascular: Normal rate and regular rhythm.   Pulmonary/Chest: Effort normal and breath sounds normal. No accessory muscle usage or stridor. No respiratory distress. She has no wheezes. She has no rhonchi. She has no rales.  Abdominal: Soft. Bowel sounds are normal. She exhibits no distension. There is no tenderness.  Musculoskeletal: Normal range of motion.  Lymphadenopathy:    She has no cervical adenopathy.  Neurological: She is alert and oriented to person, place, and time.  Speech clear without dysarthria.  Skin: Skin is warm and dry.  Psychiatric: She has a normal mood and affect. Her behavior is normal.    ED Course  Procedures (including critical care time) Labs Review Labs Reviewed  I-STAT BETA HCG BLOOD, ED ([facility],  [facility], [facility] ONLY) - Abnormal; Notable for the following:    I-stat hCG, quantitative >2000.0 (*)    All other components within normal limits    Imaging Review No results found. I have personally reviewed and evaluated these images and lab results as part of my medical decision-making.   EKG Interpretation None      MDM   Final diagnoses:  Pregnancy   Patient presents for repeat pregnancy test after having positive home test.  LMP beginning of March.  VSS, NAD.  She has no  complaints.  No dizziness, abdominal pain, vaginal bleeding, vaginal discharge, or urinary symptoms.  Doubt ectopic pregnancy, threatened abortion, or gynecologic emergency.  No indication for further evaluation or treatment at this time.  [facility] up with [facility] or [facility].  I will prescribe a prenatal vitamin.  Discussed return precautions.  Patient agrees and acknowledges the above plan for discharge.      Cheri FowlerKayla Ophie Burrowes, PA-C 11/05/15 1957  Arby BarretteMarcy Pfeiffer, MD 11/11/15 [phone]

## 2015-11-05 NOTE — ED Notes (Addendum)
Pt states she was told that she can't get pregnant, but she has not had her period since 1st week of March.  She took a test today and it said positive, so she wants to be sure so that she can get an abortion.

## 2016-07-06 IMAGING — US US TRANSVAGINAL NON-OB
1 series · 14 of 25 positions shown · non-contrast
Comparison: None

CLINICAL DATA: Pelvic pain for 3 weeks, suprapubic. LMP April 2015

EXAM:
TRANSABDOMINAL AND TRANSVAGINAL ULTRASOUND OF PELVIS
TECHNIQUE: Both transabdominal and transvaginal ultrasound examinations of the
pelvis were performed. Transabdominal technique was performed for
global imaging of the pelvis including uterus, ovaries, adnexal
regions, and pelvic cul-de-sac. It was necessary to proceed with
endovaginal exam following the transabdominal exam to visualize the
ovaries and endometrium.

[Series 1: us transvaginal non-ob · 0.26mm/px · 64 acquisitions, 14 frames shown]
[im 1/64]
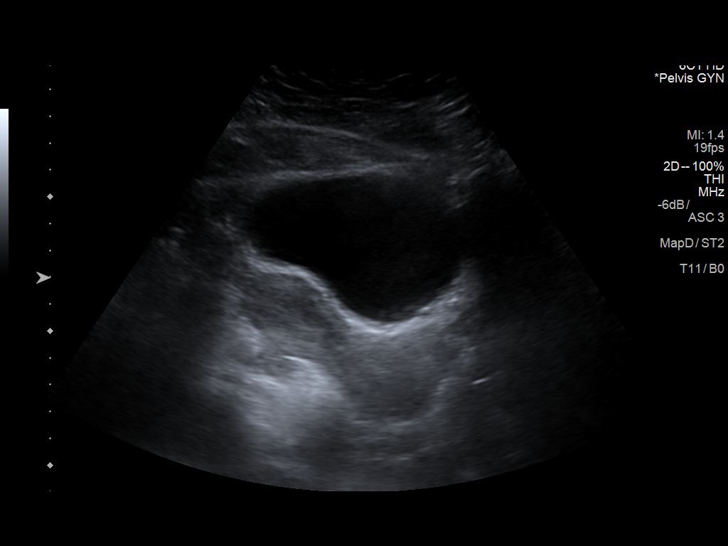
[im 6/64]
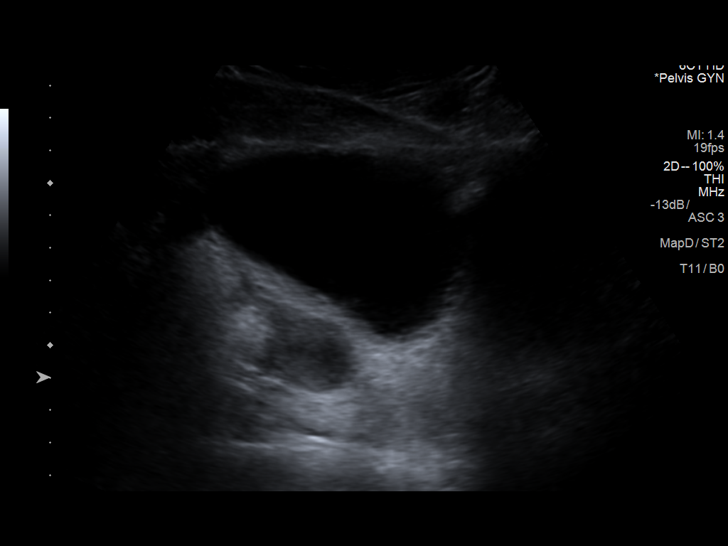
[im 11/64]
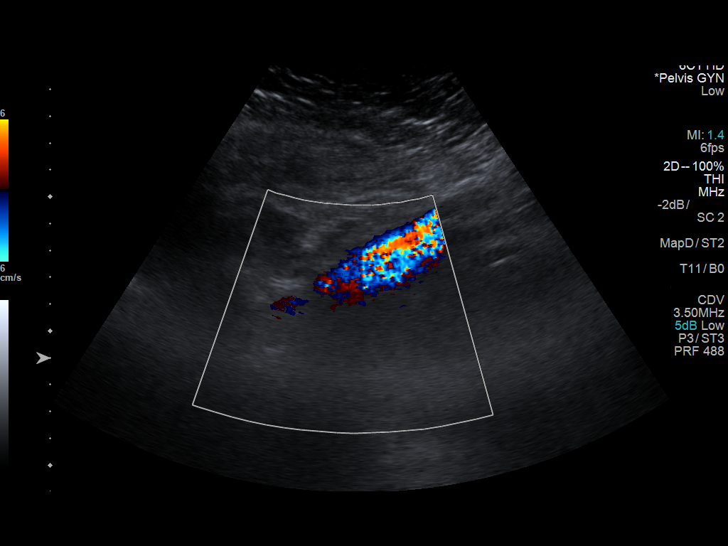
[im 16/64]
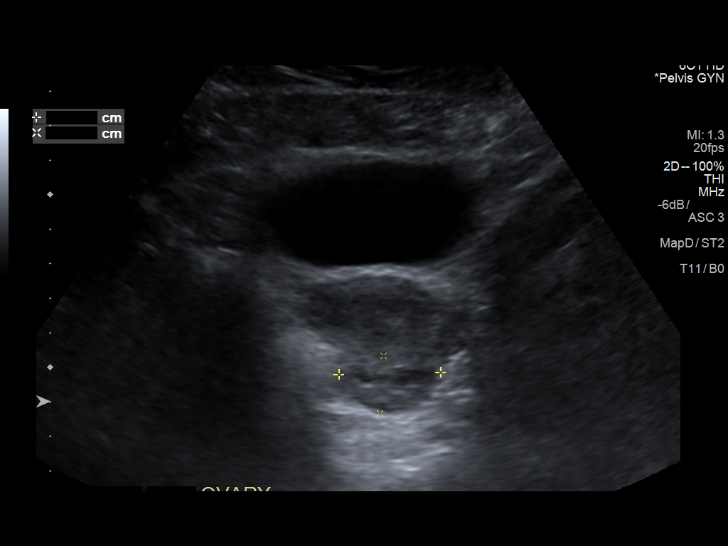
[im 22/64]
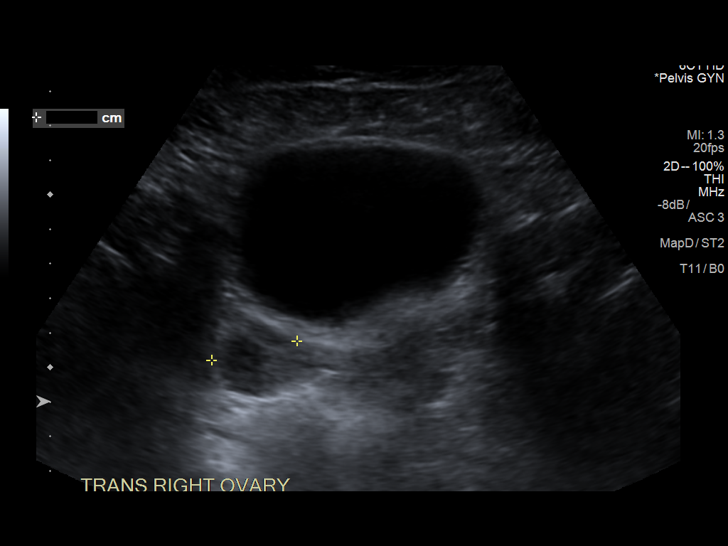
[im 24/64]
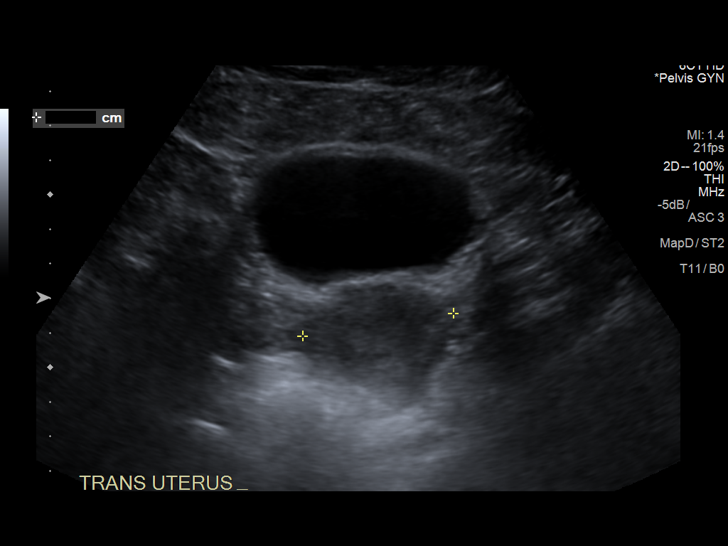
[im 29/64]
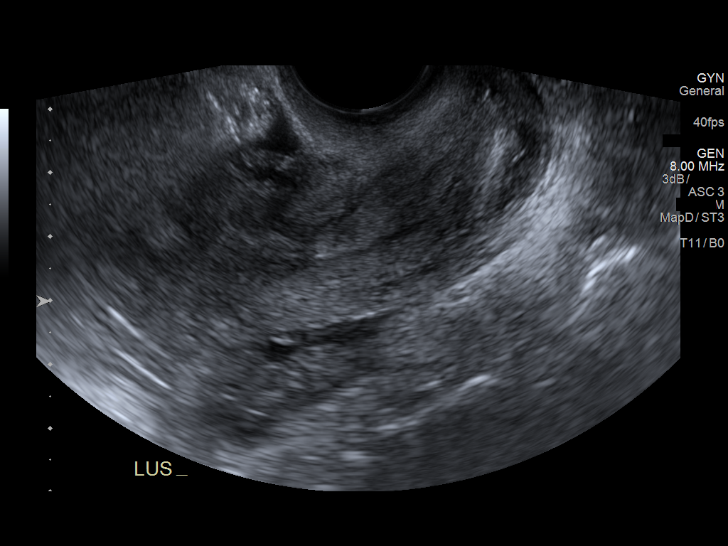
[im 35/64]
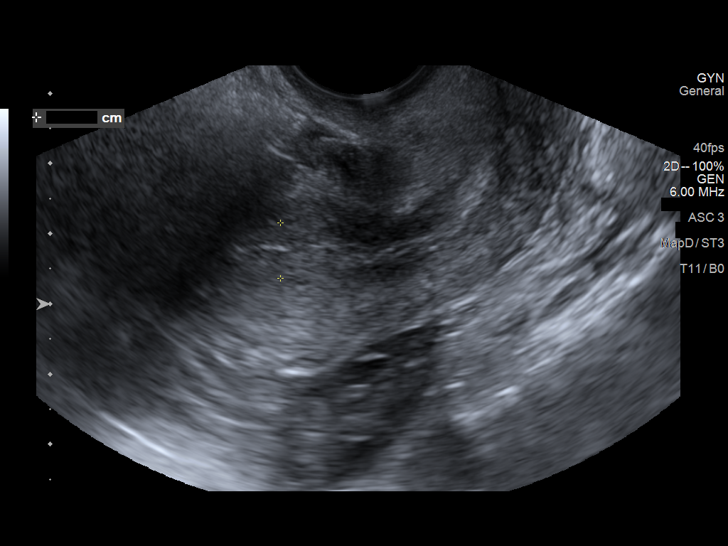
[im 40/64]
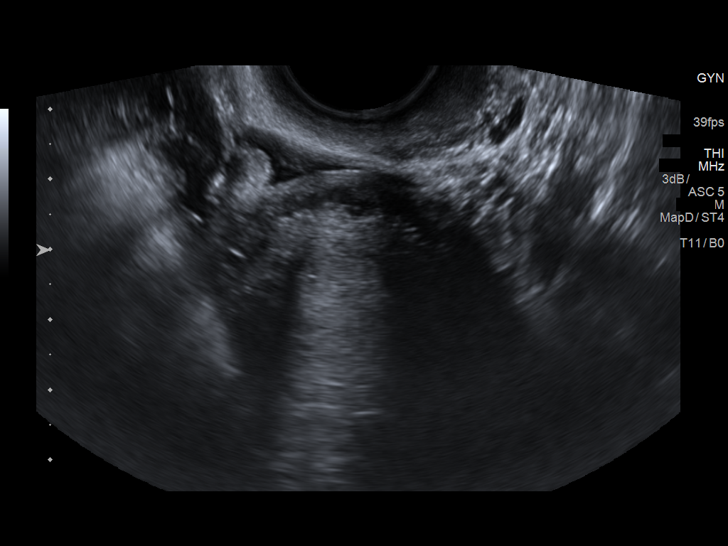
[im 43/64]
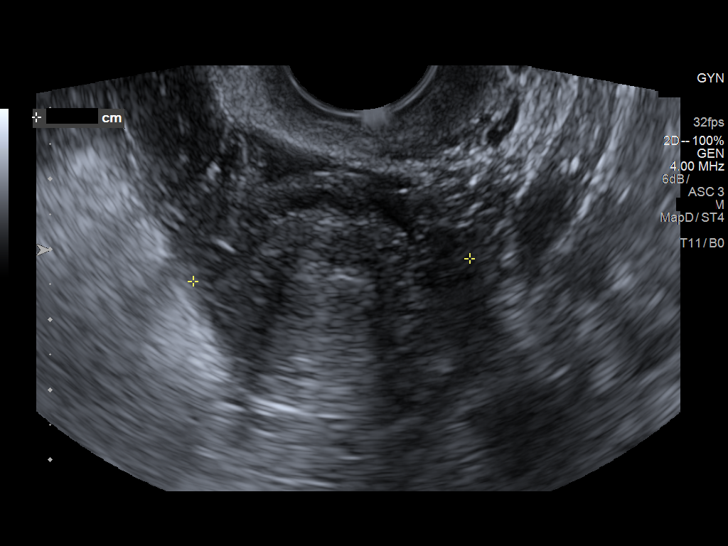
[im 48/64]
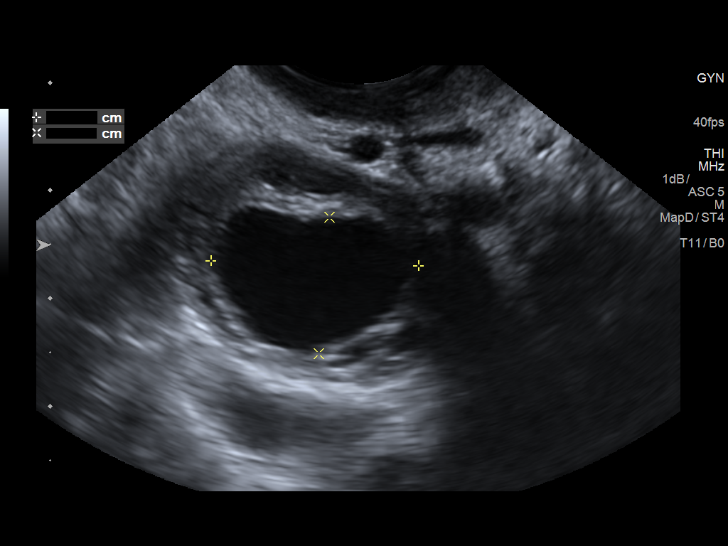
[im 53/64]
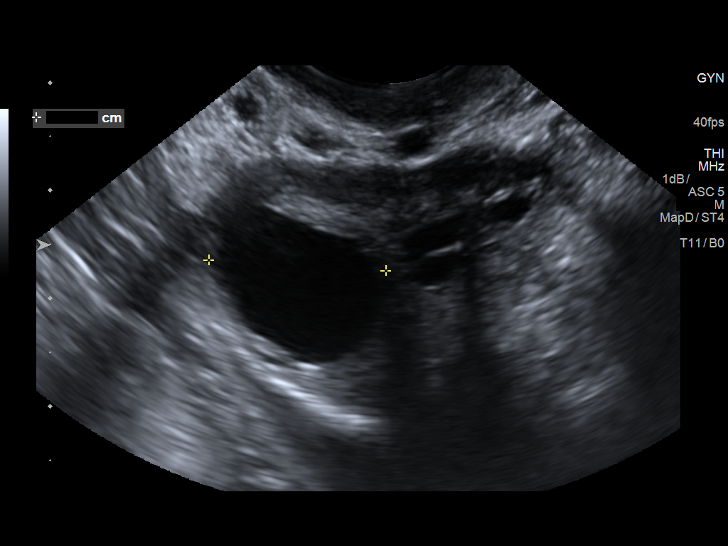
[im 58/64]
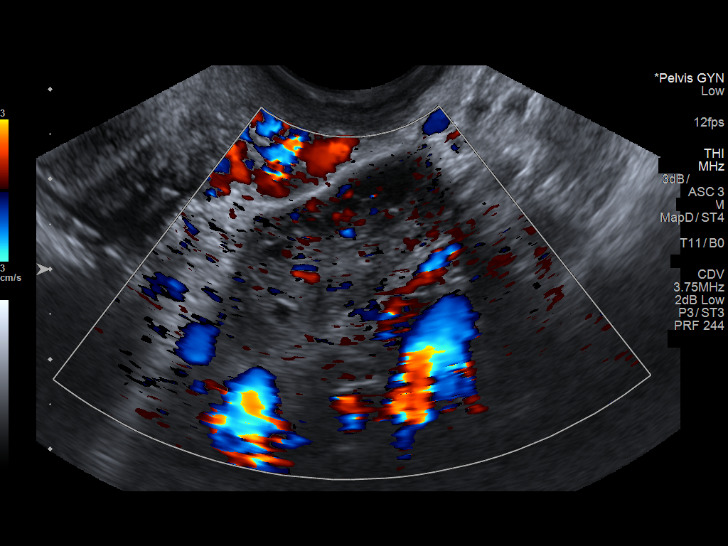
[im 64/64]
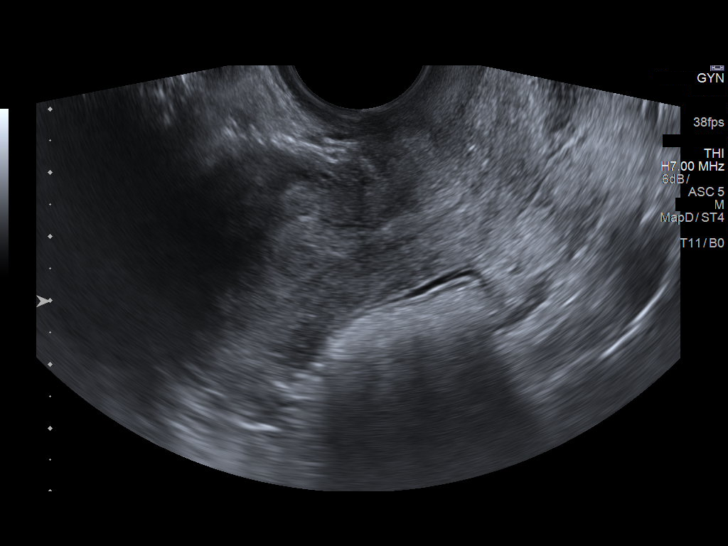

[14 of 25 positions shown; findings below may reference images not displayed]

FINDINGS: Uterus

Measurements: 8 x 3 x 4 cm. No fibroids or other mass visualized.

Endometrium

Thickness: 8 mm.  No focal abnormality visualized.

Right ovary

Measurements: 32 x 20 x 28 mm. Dominant follicle/ corpus luteal cyst
measuring 19 mm.

Left ovary

Measurements: 36 x 15 x 26 mm. Normal appearance/no adnexal mass.

Other findings

No abnormal free fluid.
IMPRESSION: Normal pelvic ultrasound.
